# Patient Record
Sex: Male | Born: 1950 | Race: Black or African American | Hispanic: No | Marital: Married | Smoking: Former smoker
Health system: Southern US, Community
[De-identification: ages and names within clinical notes are randomized; demographics above are authoritative.]

## PROBLEM LIST (undated history)

## (undated) DIAGNOSIS — G4733 Obstructive sleep apnea (adult) (pediatric): Secondary | ICD-10-CM

## (undated) DIAGNOSIS — Z9889 Other specified postprocedural states: Secondary | ICD-10-CM

## (undated) DIAGNOSIS — I2510 Atherosclerotic heart disease of native coronary artery without angina pectoris: Secondary | ICD-10-CM

## (undated) DIAGNOSIS — J45909 Unspecified asthma, uncomplicated: Secondary | ICD-10-CM

## (undated) DIAGNOSIS — F40240 Claustrophobia: Secondary | ICD-10-CM

## (undated) DIAGNOSIS — I255 Ischemic cardiomyopathy: Secondary | ICD-10-CM

## (undated) DIAGNOSIS — H409 Unspecified glaucoma: Secondary | ICD-10-CM

## (undated) DIAGNOSIS — E785 Hyperlipidemia, unspecified: Secondary | ICD-10-CM

## (undated) DIAGNOSIS — N529 Male erectile dysfunction, unspecified: Secondary | ICD-10-CM

## (undated) DIAGNOSIS — R001 Bradycardia, unspecified: Secondary | ICD-10-CM

## (undated) DIAGNOSIS — I10 Essential (primary) hypertension: Secondary | ICD-10-CM

## (undated) DIAGNOSIS — E119 Type 2 diabetes mellitus without complications: Secondary | ICD-10-CM

## (undated) DIAGNOSIS — K219 Gastro-esophageal reflux disease without esophagitis: Secondary | ICD-10-CM

## (undated) DIAGNOSIS — N200 Calculus of kidney: Secondary | ICD-10-CM

## (undated) HISTORY — DX: Obstructive sleep apnea (adult) (pediatric): G47.33

## (undated) HISTORY — DX: Male erectile dysfunction, unspecified: N52.9

## (undated) HISTORY — DX: Bradycardia, unspecified: R00.1

## (undated) HISTORY — DX: Unspecified glaucoma: H40.9

## (undated) HISTORY — DX: Ischemic cardiomyopathy: I25.5

## (undated) HISTORY — DX: Gastro-esophageal reflux disease without esophagitis: K21.9

## (undated) HISTORY — DX: Claustrophobia: F40.240

## (undated) HISTORY — DX: Essential (primary) hypertension: I10

## (undated) HISTORY — DX: Hyperlipidemia, unspecified: E78.5

## (undated) HISTORY — DX: Type 2 diabetes mellitus without complications: E11.9

## (undated) HISTORY — PX: CARDIAC CATHETERIZATION: SHX172

## (undated) HISTORY — DX: Other specified postprocedural states: Z98.890

## (undated) HISTORY — DX: Atherosclerotic heart disease of native coronary artery without angina pectoris: I25.10

## (undated) HISTORY — PX: VASECTOMY: SHX75

## (undated) HISTORY — DX: Unspecified asthma, uncomplicated: J45.909

## (undated) HISTORY — DX: Calculus of kidney: N20.0

---

## 1999-10-20 DIAGNOSIS — I2510 Atherosclerotic heart disease of native coronary artery without angina pectoris: Secondary | ICD-10-CM

## 1999-10-20 HISTORY — DX: Atherosclerotic heart disease of native coronary artery without angina pectoris: I25.10

## 2001-04-12 ENCOUNTER — Ambulatory Visit (HOSPITAL_COMMUNITY): Admission: RE | Admit: 2001-04-12 | Discharge: 2001-04-12 | Payer: Self-pay | Admitting: Gastroenterology

## 2001-04-23 ENCOUNTER — Inpatient Hospital Stay (HOSPITAL_COMMUNITY): Admission: EM | Admit: 2001-04-23 | Discharge: 2001-04-27 | Payer: Self-pay | Admitting: Cardiology

## 2001-04-23 ENCOUNTER — Encounter: Payer: Self-pay | Admitting: Emergency Medicine

## 2001-04-25 ENCOUNTER — Encounter: Payer: Self-pay | Admitting: Cardiology

## 2001-05-24 ENCOUNTER — Encounter (HOSPITAL_COMMUNITY): Admission: RE | Admit: 2001-05-24 | Discharge: 2001-08-22 | Payer: Self-pay | Admitting: Cardiology

## 2001-08-23 ENCOUNTER — Encounter (HOSPITAL_COMMUNITY): Admission: RE | Admit: 2001-08-23 | Discharge: 2001-11-21 | Payer: Self-pay | Admitting: Cardiology

## 2003-02-17 DIAGNOSIS — Z9889 Other specified postprocedural states: Secondary | ICD-10-CM

## 2003-02-17 HISTORY — PX: COLONOSCOPY: SHX5424

## 2003-02-17 HISTORY — DX: Other specified postprocedural states: Z98.890

## 2003-12-28 ENCOUNTER — Ambulatory Visit (HOSPITAL_BASED_OUTPATIENT_CLINIC_OR_DEPARTMENT_OTHER): Admission: RE | Admit: 2003-12-28 | Discharge: 2003-12-28 | Payer: Self-pay | Admitting: Otolaryngology

## 2008-12-16 ENCOUNTER — Emergency Department (HOSPITAL_COMMUNITY): Admission: EM | Admit: 2008-12-16 | Discharge: 2008-12-16 | Payer: Self-pay | Admitting: Emergency Medicine

## 2011-02-03 LAB — CBC
HCT: 41.7 % (ref 39.0–52.0)
Hemoglobin: 14.1 g/dL (ref 13.0–17.0)
MCHC: 33.7 g/dL (ref 30.0–36.0)
MCV: 90.2 fL (ref 78.0–100.0)
Platelets: 201 10*3/uL (ref 150–400)
RBC: 4.62 MIL/uL (ref 4.22–5.81)
RDW: 14.3 % (ref 11.5–15.5)
WBC: 12.1 10*3/uL — ABNORMAL HIGH (ref 4.0–10.5)

## 2011-02-03 LAB — URINALYSIS, ROUTINE W REFLEX MICROSCOPIC
Bilirubin Urine: NEGATIVE
Ketones, ur: NEGATIVE mg/dL
Nitrite: NEGATIVE
Protein, ur: 100 mg/dL — AB
Urobilinogen, UA: 0.2 mg/dL (ref 0.0–1.0)

## 2011-02-03 LAB — BASIC METABOLIC PANEL
BUN: 17 mg/dL (ref 6–23)
CO2: 24 mEq/L (ref 19–32)
Calcium: 8.9 mg/dL (ref 8.4–10.5)
Chloride: 109 mEq/L (ref 96–112)
Creatinine, Ser: 1.32 mg/dL (ref 0.4–1.5)
GFR calc Af Amer: >60 mL/min (ref >60)
GFR calc non Af Amer: 56 mL/min — ABNORMAL LOW (ref >60)
Glucose, Bld: 140 mg/dL — ABNORMAL HIGH (ref 70–99)
Potassium: 4.4 mEq/L (ref 3.5–5.1)
Sodium: 142 mEq/L (ref 135–145)

## 2011-02-03 LAB — DIFFERENTIAL
Basophils Absolute: 0 10*3/uL (ref 0.0–0.1)
Basophils Relative: 0 % (ref 0–1)
Eosinophils Absolute: 0.1 10*3/uL (ref 0.0–0.7)
Eosinophils Relative: 1 % (ref 0–5)
Monocytes Absolute: 0.3 10*3/uL (ref 0.1–1.0)
Monocytes Relative: 3 % (ref 3–12)

## 2011-02-03 LAB — URINE MICROSCOPIC-ADD ON

## 2011-02-28 ENCOUNTER — Emergency Department (HOSPITAL_COMMUNITY)
Admission: EM | Admit: 2011-02-28 | Discharge: 2011-02-28 | Disposition: A | Discharge status: Home / Self Care | Payer: Federal, State, Local not specified - PPO | Attending: Emergency Medicine | Admitting: Emergency Medicine

## 2011-02-28 ENCOUNTER — Emergency Department (HOSPITAL_COMMUNITY): Payer: Federal, State, Local not specified - PPO

## 2011-02-28 DIAGNOSIS — Z87442 Personal history of urinary calculi: Secondary | ICD-10-CM | POA: Insufficient documentation

## 2011-02-28 DIAGNOSIS — N23 Unspecified renal colic: Secondary | ICD-10-CM | POA: Insufficient documentation

## 2011-02-28 DIAGNOSIS — R319 Hematuria, unspecified: Secondary | ICD-10-CM | POA: Insufficient documentation

## 2011-02-28 DIAGNOSIS — I10 Essential (primary) hypertension: Secondary | ICD-10-CM | POA: Insufficient documentation

## 2011-02-28 LAB — URINALYSIS, ROUTINE W REFLEX MICROSCOPIC
Glucose, UA: 100 mg/dL — AB
Specific Gravity, Urine: 1.013 (ref 1.005–1.030)
Urobilinogen, UA: 0.2 mg/dL (ref 0.0–1.0)
pH: 7.5 (ref 5.0–8.0)

## 2011-02-28 LAB — POCT I-STAT, CHEM 8
Calcium, Ion: 1.09 mmol/L — ABNORMAL LOW (ref 1.12–1.32)
Chloride: 105 mEq/L (ref 96–112)
HCT: 47 % (ref 39.0–52.0)
Sodium: 140 mEq/L (ref 135–145)

## 2011-02-28 LAB — URINE MICROSCOPIC-ADD ON

## 2011-03-06 NOTE — H&P (Signed)
Scott Schwartz. [facility]  Patient:    Scott Schwartz, Scott Schwartz                      MRN: [id number] Adm. Date:  [id number] Attending:  Ophelia Shoulder Dictator:   Marya Fossa, P.A.                         History and Physical  DATE OF BIRTH:  Nov 14, 1950  ADMISSION DIAGNOSES: 1. Acute myocardial infarction. 2. No prior history of coronary artery disease. 3. Hypertension. 4. Gastroesophageal reflux disease. 5. Remote tobacco abuse. 6. IVP DYE allergy causing angioedema.  HISTORY OF PRESENT ILLNESS:  Scott Schwartz is a pleasant 60 year old married black male, new patient of Dr. Chanda Busing.  He has no prior history of coronary artery disease but does have hypertension and unknown lipid status. Remote tobacco abuse.  This afternoon, he was mowing his yard and then was cleaning off the patio when he developed severe indigestion and diaphoresis.  He went in the house and rested and took an Inderal with only mild relief.  He has a history of GERD and thought this was just a bad case.  He had an appointment at the church yesterday afternoon and went to that but on the way home had persistent and increasing discomfort rated a 9-10/10.  He had his wife bring him to the urgent care on [address].  Blood pressure was 150/110.  The patient was diaphoretic.  He was given a total of two sublingual nitroglycerins.  EMs was then called and symptoms improved to a 2.5/10.  He was also given baby aspirin.  EKG when EMS arrived showed sinus rhythm with ST elevation but this EKG was not present.  The patient was initially taken to [facility] and then transferred to [facility] for acute intervention.  EKG showed inferior ST elevation with ST changes laterally as well.  The patient was taken directly to the cardiac catheterization lab by Dr. Chanda Busing for emergency intervention.  ALLERGIES:  IVP DYE causing angioedema.  MEDICATIONS: 1.  Norvasc 5 mg a day. 2. Prilosec 20 mg a day.  PAST MEDICAL HISTORY:  Hypertension and reflux.  He has had a vasectomy in the past.  History of nephrolithiasis.  SOCIAL HISTORY:  The patient is married x 27 years.  He is the father of three.  He has no grandchildren.  He quit smoking 25 years ago.  He does smoke an occasional cigar.  Rare alcohol use.  No drugs.  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.  FAMILY HISTORY:  Father deceased age 1 of an MI.  His first MI was at age 75. Mother alive, has cancer.  One brother with hypertension.  One sister alive and well.  REVIEW OF SYSTEMS:  See history of present illness.  No palpitations or nausea.  No presyncope.  No melena or hematuria.  No significant weight changes.  PHYSICAL EXAMINATION:  (This physical exam is done on the day following his acute intervention).  VITAL SIGNS:  Blood pressure 120/60, pulse 60.  GENERAL:  The patient is alert and oriented x 3 in no acute distress sitting in the bedside chair.  HEENT:  Normocephalic, atraumatic.  PERRL, EOMI.  Nares patent.  Pharynx benign.  Positive glasses.  NECK:  Supple without bruits or masses.  LUNGS:  Clear to auscultation.  CARDIOVASCULAR:  Bradycardic rate.  No murmur.  Regular rhythm.  Normal S1, S2.  ABDOMEN:  Soft, nontender, nondistended.  Normoactive bowel sounds x 4 quadrants.  No HSM.  EXTREMITIES:  2+ femoral pulses.  Right groin is stable post catheterization. 2+ distal pulses bilaterally without edema.  LABORATORY DATA:  EKG today shows normal sinus rhythm with evolving MI pattern.  Labs pending.  HOSPITAL COURSE:  The patient is admitted for acute myocardial infarction.  He was taken directly to the cardiac catheterization lab by Dr. Elsie Lincoln.  He was treated with IV heparin, IV nitroglycerin, and IIB/IIIA inhibitor therapy, as well as Plavix and aspirin.  We will check a lipid profile in the morning.  We will start Lopressor and  Altace. DD:  04/24/01 TD:  04/24/01 Job: [id number]

## 2011-03-06 NOTE — H&P (Signed)
[place]. [facility]  Patient:    Scott Schwartz, Scott Schwartz                      MRN: [id number] Adm. Date:  [id number] Attending:  Ophelia Shoulder CC:         Barry Dienes. Eloise Harman, M.D.,  [facility]  Cardiac Catheterization Lab   History and Physical  PROCEDURES PERFORMED: 1. Cutting balloon angioplasty of mid left anterior descending artery which    is the infarct-related vessel causing the patients heart attack. 2. Balloon angioplasty of the ostium first diagonal branch of the left    anterior descending artery. 3. Selective coronary angiography by Judkins technique. 4. Retrograde left heart catheterization. 5. Left ventricular angiography.  CARDIOLOGIST:  Madaline Savage, M.D.  COMPLICATIONS:  None.  ENTRY SITE:  Right femoral.  DYE USED:  Omnipaque.  OTHER MEDICATIONS GIVEN:  Because of the patients history of allergy to IVP DYE from childhood, the patient was given 150 mg of Solu-Medrol in the catheterization lab as well as 40 mg IV Pepcid.  No allergic manifestations were noted during the case.  The patient also received Integrilin by double bolus infusion as well as weight-adjusted heparin.  His ACT was 270 during the case.  Versed was also given for sedation.  PATIENT PROFILE:  The patient is a 60 year old gentleman who had severe discomfort at 10 a.m. this morning and presented to an urgent care center where his EKG was abnormal.  It is my understanding that the patient was transferred from the urgent care center to [facility] by ambulance for suspicion of acute MI.  He there was seen by the emergency department physician who called me and faxed me an EKG which confirmed his suspicion that the patient was having anterolateral injury pattern.  The patient was transferred to the catheterization lab at [facility], and emergency cardiac catheterization was begun.  RESULTS:  PRESSURES:  The  left ventricular pressure was 145/20, central aortic pressure was 145/90 with a mean of 115.  No aortic valve gradient noted by pullback technique.  ANGIOGRAPHIC RESULTS:  The left main coronary artery was large and normal.  The LAD coursed to the cardiac apex, wrapped around the apex, and was a large caliber vessel.  The LAD contained a 75% stenosis in the mid portion of the vessel just beyond the septal perforator that was approximately 12 to 15 mm in length and concentric in nature, B1 type lesion.  The distal vessel appeared normal.  No clot-like material was found.  There was some hypodense plaque-like material.  The ostium of the first diagonal arising just before the first septal perforator branch contained a 75% ostial stenosis.  The left circumflex was nondominant and gave rise to one marginal branch which was normal.  The right coronary artery was large and dominant and showed no lesions.  The left ventricle showed absences of any mitral regurgitation.  It showed a mildly dilated ventricle.  The anterobasilar wall moved normally.  The anterolateral wall was mildly hypokinetic.  The anteroapical, apical, and inferoapical wall showed severe hypokinesis.  I estimated ejection fraction at 40%.  No LV thrombus was noted.  INTERVENTIONAL PROCEDURE:  Interventional procedure was performed with a 7-French 4 left guide catheter without side holes, a high-torque floppy guidewire, and sequential use of a 3.25 x 15 mm cutting balloon, then a 3.5 x 10 mm cutting balloon, and finally a 3.75 x 15 mm  cutting balloon.  Peak inflation pressure on the last cutting balloon was to 6 atmospheres of pressure, corresponding to an estimated luminal diameter of 3.75.  At the completion of that last cutting balloon inflation, the lumen appeared smooth and greatly improved from 75% to 10% residual.  The patient still had residual pain and ST elevation, so I drove a guidewire down into his  diagonal, which was a sharp right angle turn, and dilated the ostial portions of the diagonal with a 3.0 x 9 mm Maverick balloon on three different occasions to a peak inflation pressure of 6 atmospheres of pressure.  No complications occurred.  That lesion was reduced from 75% to 0% residual.  Both diagonal and distal LAD maintained TIMI-3 flow during the procedure.  ST segment and chest pain improved after the last balloon inflation.  FINAL DIAGNOSES: 1. Acute anterolateral and apical myocardial infarction, onset 10 a.m..    Infarct-related vessel, left anterior descending artery and diagonal. 2. Successful percutaneous transluminal coronary angioplasty of both diagonal    and left anterior descending artery with reduction of both 75% lesions to    10% residual. 3. Normal circumflex and right coronary artery.  PLAN:  Medical therapy. DD:  04/23/01 TD:  04/23/01 Job: [id number]

## 2011-03-06 NOTE — Discharge Summary (Signed)
[place]. [facility]  Patient:    Scott Schwartz, Scott Schwartz                      MRN: [id number] Adm. Date:  [id number] Disc. Date: 04/27/01 Attending:  Ophelia Shoulder Dictator:   Halford Decamp. Delanna Ahmadi, R.N., N.P. CC:         Barry Dienes. Eloise Harman, M.D.   Discharge Summary  HISTORY OF PRESENT ILLNESS:  Scott Schwartz is a 60 year old, married, African-American, a new patient of Madaline Savage, M.D., who was admitted with an acute MI.  His EKG showed inferior ST elevation and some ST changes in the lateral leads.  He apparently was experiencing chest pain on and off, somewhat severe.  He went to urgent care on [address] and then was transferred to [facility] where his EKG showed an acute MI.  HOSPITAL COURSE:  He was taken to the catheterization lab immediately by Madaline Savage, M.D.  He was found to have two blockages, 75% LAD and mid after diagonal 1, and a 75% ostial diagonal 1.  He underwent cutting balloon PTCA of his LAD, reduced from 75% to 10% and balloon PTCA of his ostial diagonal.  He was placed on heparin.  ACE inhibitor and statin were added with Plavix and beta blocker.  His outpatient Norvasc was held.  He was seen by cardiac rehabilitation phase 1 and referred for phase 2.  On April 27, 2001, he was considered stable to be discharged home.  His kidney and electrolyte function were within normal limits.  He had no arrhythmias.  His telemetry showed sinus bradycardia, rate 58, and sinus rhythm, rate 64.  LABORATORY DATA:  On April 26, 2001, the sodium was 137, potassium 4.1, BUN 18, creatinine 1.0, and glucose 117.  The hemoglobin was 13, hematocrit 37.3, platelets 272, and WBC 14.1.  To note, he did receive steroids secondary to IVP dye allergy.  His CPK-MB #1 was 365/50.8 and #2 was 536/94.5.  The total cholesterol was 173, triglycerides 127, HDL 48, and LDL 100.  The TSH was 0.06.  The chest x-ray on April 25, 2001, showed no active  disease.  DISCHARGE MEDICATIONS: 1. Enteric-coated aspirin 325 mg once per day. 2. Plavix 75 mg once per day x 1 month. 3. Metoprolol 25 mg twice per day. 4. Zocor 20 mg at bedtime every night. 5. Altace 2.5 mg twice per day. 6. Prilosec as taken previously.  ACTIVITY:  No strenuous activity.  He is not to return to work until seen in the office.  DIET:  He should be on a low-fat diet.  WOUND CARE:  If he has any problems with his groin, he will call our office.  FOLLOW-UP:  He will follow up with Madaline Savage, M.D., as an outpatient.  DISCHARGE DIAGNOSES: 1. Acute myocardial infarction. 2. Atherosclerotic cardiovascular disease, status post cardiac catheterization    emergently secondary to acute myocardial infarction with cutting balloon    angioplasty of his mid left anterior descending and balloon angioplasty of    his diagonal 1. 3. Ejection fraction at the time of cardiac catheterization was 40% with    severe hypokinesis of his apex.  A 2-D echocardiogram was done with    resolution of ejection fraction to normal. 4. Hypertension. 5. Premature family history of coronary artery disease. 6. 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 DD:  04/27/01 TD:  04/27/01 Job: [id number]

## 2012-07-21 DIAGNOSIS — N529 Male erectile dysfunction, unspecified: Secondary | ICD-10-CM | POA: Insufficient documentation

## 2012-07-21 DIAGNOSIS — I10 Essential (primary) hypertension: Secondary | ICD-10-CM | POA: Insufficient documentation

## 2012-07-21 DIAGNOSIS — E7800 Pure hypercholesterolemia, unspecified: Secondary | ICD-10-CM | POA: Insufficient documentation

## 2012-07-21 DIAGNOSIS — I252 Old myocardial infarction: Secondary | ICD-10-CM | POA: Insufficient documentation

## 2013-09-01 ENCOUNTER — Telehealth: Payer: Self-pay | Admitting: Neurology

## 2013-09-01 NOTE — Telephone Encounter (Signed)
I called and left a message for the patient to callback to the office to schedule his sleep consult with Dr. Vickey Huger.

## 2013-09-22 ENCOUNTER — Encounter: Payer: Self-pay | Admitting: Neurology

## 2013-09-22 ENCOUNTER — Encounter (INDEPENDENT_AMBULATORY_CARE_PROVIDER_SITE_OTHER): Payer: Self-pay

## 2013-09-22 ENCOUNTER — Ambulatory Visit (INDEPENDENT_AMBULATORY_CARE_PROVIDER_SITE_OTHER): Payer: Federal, State, Local not specified - PPO | Admitting: Neurology

## 2013-09-22 VITALS — BP 102/70 | HR 54 | Ht 66.75 in | Wt 177.0 lb

## 2013-09-22 DIAGNOSIS — Z9989 Dependence on other enabling machines and devices: Secondary | ICD-10-CM

## 2013-09-22 DIAGNOSIS — G478 Other sleep disorders: Secondary | ICD-10-CM

## 2013-09-22 DIAGNOSIS — I25810 Atherosclerosis of coronary artery bypass graft(s) without angina pectoris: Secondary | ICD-10-CM

## 2013-09-22 DIAGNOSIS — G4733 Obstructive sleep apnea (adult) (pediatric): Secondary | ICD-10-CM

## 2013-09-22 DIAGNOSIS — F40240 Claustrophobia: Secondary | ICD-10-CM

## 2013-09-22 DIAGNOSIS — F40298 Other specified phobia: Secondary | ICD-10-CM

## 2013-09-22 HISTORY — DX: Obstructive sleep apnea (adult) (pediatric): G47.33

## 2013-09-22 HISTORY — DX: Claustrophobia: F40.240

## 2013-09-22 NOTE — Patient Instructions (Signed)
CPAP and BIPAP Information CPAP and BIPAP are methods of helping you breathe with the use of air pressure. CPAP stands for "continuous positive airway pressure." BIPAP stands for "bi-level positive airway pressure." In both methods, air is blown into your air passages to help keep you breathing well. With CPAP, the amount of pressure stays the same while you breathe in and out. CPAP is most commonly used for obstructive sleep apnea. For obstructive sleep apnea, CPAP works by holding your airways open so that they do not collapse when your muscles relax during sleep. BIPAP is similar to CPAP except the amount of pressure is increased when you inhale. This helps you take larger breaths. Your health care provider will recommend whether CPAP or BIPAP would be more helpful for you.  WHY ARE CPAP AND BIPAP TREATMENTS USED? CPAP or BIPAP can be helpful if you have:   Sleep apnea.   Chronic obstructive pulmonary disease (COPD).   Diseases that weaken the muscles of the chest, including muscular dystrophy or neurological diseases such as amyotrophic lateral sclerosis (ALS).   Other problems that cause breathing to be weak, abnormal, or difficult.  HOW IS CPAP OR BIPAP ADMINISTERED? Both CPAP and BIPAP are provided by a small machine with a flexible plastic tube that attaches to a plastic mask. The mask fits on your face, and air is blown into your air passages through your nose or mouth. The amount of pressure that is used to blow the air into your air passages can be set on the machine. Your health care provider will determine the pressure setting that should be used based on your individual needs.  WHEN SHOULD CPAP OR BIPAP BE USED? In most cases, the mask is worn only when sleeping. Generally, you will need to wear the mask throughout the night and during the daytime if you take a nap. In a few cases involving certain medical conditions, people also need to wear the mask at other times when they are  awake. Follow your health care provider's instructions for when to use the machine.  USING THE MASK  Because the mask needs to be snug, some people feel a trapped or closed-in feeling (claustrophobic) when first using the mask. You may need to get used to the mask gradually. To do this, you can first hold the mask loosely over your nose or mouth. Gradually apply the mask more snugly. You can also gradually increase the amount of time that you use the mask.   Masks are available in various types and sizes. Some fit over your mouth and nose, and some fit over just your nose. If your mask does not fit well, talk to your health care provider about getting a different one.  If you are using a nasal mask and you tend to breathe through your mouth, a chin strap may be applied to help keep your mouth closed.   The CPAP and BIPAP machines have alarms that may sound if the mask comes off or develops a leak.   If you have trouble with the mask, it is very important that you talk to your health care provider about finding a way to make the mask easier to tolerate. Do not stop using the mask. This could have a negative impact on your health. TIPS FOR USING THE MACHINE  Place your CPAP or BIPAP machine on a secure table or stand near an electrical outlet.   Know where the on-off switch is located on the machine.     Follow your health care provider's instructions for how to set the pressure on your machine and when you should use it.   Do not eat or drink while the CPAP or BIPAP machine is on. Food or fluids could get pushed into your lungs by the pressure of the CPAP or BIPAP.  Do not smoke. Tobacco smoke residue can damage the machine.   For home use, CPAP and BIPAP machines can be rented or purchased through home health care companies. Many different brands of machines are available. Renting a machine before purchasing may help you find out which particular machine works well for you. SEEK  IMMEDIATE MEDICAL CARE IF:  You have redness or open areas around your nose or mouth where the mask fits.   You have trouble operating the CPAP or BIPAP machine.   You cannot tolerate wearing the CPAP or BIPAP mask.  Document Released: 07/03/2004 Document Revised: 06/07/2013 Document Reviewed: 05/04/2013 Exit[employer] Patient Information 2014 [employer]. Sleep Apnea  Sleep apnea is a sleep disorder characterized by abnormal pauses in breathing while you sleep. When your breathing pauses, the level of oxygen in your blood decreases. This causes you to move out of deep sleep and into light sleep. As a result, your quality of sleep is poor, and the system that carries your blood throughout your body (cardiovascular system) experiences stress. If sleep apnea remains untreated, the following conditions can develop:  High blood pressure (hypertension).  Coronary artery disease.  Inability to achieve or maintain an erection (impotence).  Impairment of your thought process (cognitive dysfunction). There are three types of sleep apnea: 1. Obstructive sleep apnea Pauses in breathing during sleep because of a blocked airway. 2. Central sleep apnea Pauses in breathing during sleep because the area of the brain that controls your breathing does not send the correct signals to the muscles that control breathing. 3. Mixed sleep apnea A combination of both obstructive and central sleep apnea. RISK FACTORS The following risk factors can increase your risk of developing sleep apnea:  Being overweight.  Smoking.  Having narrow passages in your nose and throat.  Being of older age.  Being male.  Alcohol use.  Sedative and tranquilizer use.  Ethnicity. Among individuals younger than 35 years, African Americans are at increased risk of sleep apnea. SYMPTOMS   Difficulty staying asleep.  Daytime sleepiness and fatigue.  Loss of energy.  Irritability.  Loud, heavy snoring.  Morning  headaches.  Trouble concentrating.  Forgetfulness.  Decreased interest in sex. DIAGNOSIS  In order to diagnose sleep apnea, your caregiver will perform a physical examination. Your caregiver may suggest that you take a home sleep test. Your caregiver may also recommend that you spend the night in a sleep lab. In the sleep lab, several monitors record information about your heart, lungs, and brain while you sleep. Your leg and arm movements and blood oxygen level are also recorded. TREATMENT The following actions may help to resolve mild sleep apnea:  Sleeping on your side.   Using a decongestant if you have nasal congestion.   Avoiding the use of depressants, including alcohol, sedatives, and narcotics.   Losing weight and modifying your diet if you are overweight. There also are devices and treatments to help open your airway:  Oral appliances. These are custom-made mouthpieces that shift your lower jaw forward and slightly open your bite. This opens your airway.  Devices that create positive airway pressure. This positive pressure "splints" your airway open to help you breathe   better during sleep. The following devices create positive airway pressure:  Continuous positive airway pressure (CPAP) device. The CPAP device creates a continuous level of air pressure with an air pump. The air is delivered to your airway through a mask while you sleep. This continuous pressure keeps your airway open.  Nasal expiratory positive airway pressure (EPAP) device. The EPAP device creates positive air pressure as you exhale. The device consists of single-use valves, which are inserted into each nostril and held in place by adhesive. The valves create very little resistance when you inhale but create much more resistance when you exhale. That increased resistance creates the positive airway pressure. This positive pressure while you exhale keeps your airway open, making it easier to breath when you  inhale again.  Bilevel positive airway pressure (BPAP) device. The BPAP device is used mainly in patients with central sleep apnea. This device is similar to the CPAP device because it also uses an air pump to deliver continuous air pressure through a mask. However, with the BPAP machine, the pressure is set at two different levels. The pressure when you exhale is lower than the pressure when you inhale.  Surgery. Typically, surgery is only done if you cannot comply with less invasive treatments or if the less invasive treatments do not improve your condition. Surgery involves removing excess tissue in your airway to create a wider passage way. Document Released: 09/25/2002 Document Revised: 01/30/2013 Document Reviewed: 02/11/2012 Exit[employer] Patient Information 2014 [employer].  

## 2013-09-22 NOTE — Progress Notes (Addendum)
[facility]  Provider:  Melvyn Novas, M D  Referring Provider: Jarome Matin, MD Primary Care Physician:  Garlan Fillers, MD  Chief Complaint  Patient presents with  . Sleeping Problem    HPI:  Scott Schwartz is a 62 y.o. male  Is seen here as a referral  from Dr. Eloise Harman for evaluation of sleep apnea. Scott Schwartz is a recently retired , right-handed, African American gentleman, who  presents with a past medical history of myocardial infarction / coronary artery disease , had frequent angina pectoris,  diabetes mellitus, hyperlipidemia, hypertension and carries also a diagnosis of asthma and GERD.  He was diagnosed with sleep apnea over 10 years ago at [facility] , a mild form of  apnea ,he stated.  was prescribed a CPAP , but was never followed by a sleep clinic and became non compliant from the start.  CPAP was not well tolerated and he felt he could breath-  Interestingly the study had also revealed severe periodic limb movements, sleep  talking and twitching at night.   Scott Schwartz reported that her husband was still  snoring very loudly -interrupting her sleep in the process. He felt fatigued, drained and cognitively without energy.    For that reason a sleep study was repeated on 03-24-12, at [facility] sleep at [facility]:  The patient again had a rather  mild apnea, in his case the AHI was 14.0,  his RDI however was moderate , indicating upper airway resistance , at 26 per hour.  In addition supine sleep accentuated his apnea to an AHI of 33.8 per hour- but REM sleep seem not to have an effect.  He did not have prolonged desaturations only 2.5 minutes of desaturations were noted.  Due to his co-morbidities, CAD, asthma  and his bradycardia, he was split : CPAP was initiated at  5 cm  and he was titrated to a pressure of 8 cm water.  Already at a pressure of 7 cm water there was a reduction of the AHI to 0.0.  Notable was also that the patient had significant REM  sleep rebound (25%).  The patient was able to sleep in supine position at 7 cm water pressure and his periodic limb movement eased under CPAP titration.  It was however evident that he did not tolerate the CPAP that well - and his sleep remained fragmented.  The patient did better with a small  nasal pillow FX interface in medium-size.  Now 18 month after titration, Mr Norrod reports he is not using the machine - had in fact never reset the machine and was concerned about the related costs- the same for the  Mask. He has still his old machine from 2005, the one "from the closet'.   His Epworth score is 9 points,  But FSS 46 points, He still feels that concentration and focus, his cognition are not improved. He reports choking , suffocating feeling and felt that a switch to a nasal pillow could be helpful. He had one ordered , but was financially unable to afford a machine and new mask.  Sleep habits:   The patient reports that he wakes up spontaneously between 5 and 5:30 AM, normally does need an alarm. He is retired , has no regular appointments.  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 He eats breakfast between 7.30 and 8 PM. Every morning, week days and week end have similar routines.  Coffee at one cup per day, no sodas. No tobacco  use, no ETOH use. Skim milk, 1 cup.  He will sometimes nap in early afternoon , after Lunch . Usually less than 10 minutes of nap time on the sofa- while watching TV .  He is leaving the home regularly to garden , he walks and he exposes himself to daylight.  He is a member at the [facility] but has lapsed in his routine over the last 6 month. Bedtime is usually after one hour of TV- TV  in the bedroom-  around 11 PM, falls asleep promptly.   No nocturia. He had trouble sleeping with the CPAP, therefor there were no accessible data on the machine. He goes to bed in a cool , dark , but not quiet place  ( states his wife watches TV on a wide screen , the emitting light  bothers him ) .  Wife ( who is not present) still reports him snoring loudly, and frequently kicking. According to his  wife's reports , she is noticing him thrashing about  1-2 times weekly , frequency increasingly over the last 12 month. Loudly yelling, defending or  fighting someone or something in his dreams.  His spells last 2 minutes , may be single or up to 3 times at night , the content of the dream is often  recalled by the patient- he feels in danger, threatened.  These spells ocurr between 3-5 AM, likely REM BD.  He feels poorly rested after one of ' those nights' . He rarely has a morning headache. He gets headaches frequently around noon time.   He can fall asleep but often will not stay asleep.  Only once did he leave the bed and watched under the bed - went back to sleep. He wakes with a very dry mouth and phlegm, no dry eyes.    Review of Systems: Out of a complete 14 system review, the patient complains of only the following symptoms, and all other reviewed systems are negative. Epworth 9 points. FSS  43   , GDS 2 points.  Neck pain, neck stiffness, snoring apnea frequent waking sleep talking sleep acting out dreams, headaches. There were no recent hospitalizations or surgeries no immediate changes in the family history.    A review of the last laboratories the sleep study from 2013 labs from November 2014 . The only concern here is a rather high triglyceride level and a slight unfavorable LDL/ HDL ratio.  History   Social History  . Marital Status: Single    Spouse Name: N/A    Number of Children: 3  . Years of Education: N/A   Occupational History  . RET     POSTAL   Social History Main Topics  . Smoking status: Former Smoker -- 7 years    Types: Cigarettes  . Smokeless tobacco: Never Used     Comment: Pt Quit 1970's  . Alcohol Use: No  . Drug Use: No  . Sexual Activity: Not on file   Other Topics Concern  . Not on file   Social History Narrative   Pt  married 3 children    1 grandchild   Ret [employer]   Former smoker 1 pack x's 36 yrs    Quit in 1970's    Family History  Problem Relation Age of Onset  . Leukemia Mother 60  . CAD Father 50  . CAD Brother     Past Medical History  Diagnosis Date  . History of colonoscopy 02/17/2003  . GERD (  gastroesophageal reflux disease)   . CAD (coronary artery disease)   . MI (myocardial infarction) 2002  . Hyperlipidemia   . Asthma   . Glaucoma     Borderline  . OSA on CPAP     Past Surgical History  Procedure Laterality Date  . Cardiac catheterization    . Colonoscopy  02/2003  . Vasectomy      Current Outpatient Prescriptions  Medication Sig Dispense Refill  . Albuterol (PROVENTIL IN) Inhale 1-2 puffs into the lungs 3 (three) times daily.      Marland Kitchen ALPRAZolam (XANAX) 0.25 MG tablet Take 0.25 mg by mouth at bedtime as needed for anxiety.      . AMLODIPINE BESYLATE PO Take 5 mg by mouth.      Marland Kitchen aspirin 81 MG tablet Take 81 mg by mouth daily.      . Fish Oil OIL by Does not apply route.      . fluticasone (FLONASE) 50 MCG/ACT nasal spray Place into both nostrils daily.      . folic acid (FOLVITE) 1 MG tablet Take 1 mg by mouth daily.      Marland Kitchen losartan (COZAAR) 100 MG tablet Take 100 mg by mouth daily.      . metoprolol succinate (TOPROL-XL) 50 MG 24 hr tablet Take 50 mg by mouth daily. Take with or immediately following a meal.      . montelukast (SINGULAIR) 10 MG tablet Take 10 mg by mouth at bedtime.      . Multiple Vitamin (MULTIVITAMIN) tablet Take 1 tablet by mouth daily.      . nitroGLYCERIN (NITROLINGUAL) 0.4 MG/SPRAY spray Place 1 spray under the tongue every 5 (five) minutes x 3 doses as needed for chest pain.      . pravastatin (PRAVACHOL) 40 MG tablet Take 40 mg by mouth daily.      . propranolol (INDERAL) 10 MG tablet Take 10 mg by mouth 2 (two) times daily.      . sildenafil (VIAGRA) 100 MG tablet Take 100 mg by mouth daily as needed for erectile dysfunction.      .  silodosin (RAPAFLO) 8 MG CAPS capsule Take 8 mg by mouth daily with breakfast.      . tamsulosin (FLOMAX) 0.4 MG CAPS capsule Take 0.4 mg by mouth.       No current facility-administered medications for this visit.    Allergies as of 09/22/2013 - Review Complete 09/22/2013  Allergen Reaction Noted  . Ivp dye [iodinated diagnostic agents] Shortness Of Breath 09/22/2013    Vitals: BP 102/70  Pulse 54  Ht 5' 6.75" (1.695 m)  Wt 177 lb (80.287 kg)  BMI 27.95 kg/m2 Last Weight:  Wt Readings from Last 1 Encounters:  09/22/13 177 lb (80.287 kg)   Last Height:   Ht Readings from Last 1 Encounters:  09/22/13 5' 6.75" (1.695 m)    Physical exam:  General: The patient is awake, alert and appears not in acute distress. The patient is well groomed. Head: Normocephalic, atraumatic. Neck is supple. Mallampati 2- elongated uvula lifts above tongue ground , neck circumference: 16, nasal airway unrestricted.  Cardiovascular:  Regular rate and rhythm , without  murmurs or carotid bruit, and without distended neck veins. Respiratory: Lungs are clear to auscultation. Skin:  Without evidence of edema, or rash Trunk: BMI is 28-   has normal posture.  Neurologic exam : The patient is awake and alert, oriented to place and time.  Memory subjective  described as  intact.  There is a  Subjective limitation to his  attention span & concentration ability.  Speech is fluent without  dysarthria, dysphonia or aphasia. Mood and affect are appropriate.  Cranial nerves: Pupils are equal and briskly reactive to light. Funduscopic exam without  evidence of pallor or edema. Extraocular movements  in vertical and horizontal planes intact and without nystagmus. Visual fields by finger perimetry are intact. Hearing to finger rub intact.  Facial sensation intact to fine touch.  Facial motor strength is symmetric and tongue and uvula move midline.  Motor exam:    Normal tone , muscle bulk and symmetric strength in  all extremities.  Sensory:  Fine touch, pinprick and vibration were tested in all extremities.  Proprioception is normal.  Coordination: Rapid alternating movements in the fingers/hands is tested and normal.  Finger-to-nose maneuver tested and normal without evidence of ataxia, dysmetria or tremor.  Gait and station: Patient walks without assistive device,  and is able to rise form a chair without  bracing , unassisted to climb up to the exam table.  Strength within normal limits. Stance is stable and normal.  Tandem gait is slighlty fragmented. Romberg testing is  normal.  Deep tendon reflexes: in the  upper and lower extremities are symmetric and intact. Babinski maneuver response is downgoing.   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 positional dependent according to spouse observation- he snores more on his left side , but feels best in that position. His wife reports apneas .  3)claustrophobia.  Plan : I like for the Patien tot take a nasal pillow mask home and "to play with it " until he feels comfortable wearing it attached to CPAP .  He went after this appointment to the sleep lab , and meets with the technologist to fit a mask.   His machine was never reset to 7 cm water. I will invite him for a re-titration after desensitization.   PS : note to tech : BCBS patient , retirement federal BCBS - 4% but [facility] does not need to be established again, direct re-titration. Return for a retitration and to obtain new machine after desensitization.  I Plan:  Treatment plan and additional workup : untreated OSA and UARS - needs re titration on nasal pillow, desensitization, gentle titration. RV in 8 weeks with me. Has to bring his than new  machine.

## 2014-07-02 ENCOUNTER — Telehealth: Payer: Self-pay | Admitting: Cardiology

## 2014-07-02 NOTE — Telephone Encounter (Signed)
Letterhead faxed to [facility] to obtain Pt's records for NP appt 9.17.15 9.14.15/km

## 2014-07-05 ENCOUNTER — Ambulatory Visit (INDEPENDENT_AMBULATORY_CARE_PROVIDER_SITE_OTHER): Payer: Federal, State, Local not specified - PPO | Admitting: Cardiology

## 2014-07-05 ENCOUNTER — Encounter: Payer: Self-pay | Admitting: Cardiology

## 2014-07-05 VITALS — BP 104/66 | HR 48 | Ht 66.75 in | Wt 179.4 lb

## 2014-07-05 DIAGNOSIS — I10 Essential (primary) hypertension: Secondary | ICD-10-CM

## 2014-07-05 DIAGNOSIS — E119 Type 2 diabetes mellitus without complications: Secondary | ICD-10-CM | POA: Insufficient documentation

## 2014-07-05 DIAGNOSIS — R609 Edema, unspecified: Secondary | ICD-10-CM

## 2014-07-05 DIAGNOSIS — E785 Hyperlipidemia, unspecified: Secondary | ICD-10-CM

## 2014-07-05 DIAGNOSIS — R600 Localized edema: Secondary | ICD-10-CM | POA: Insufficient documentation

## 2014-07-05 DIAGNOSIS — N200 Calculus of kidney: Secondary | ICD-10-CM | POA: Insufficient documentation

## 2014-07-05 DIAGNOSIS — I2510 Atherosclerotic heart disease of native coronary artery without angina pectoris: Secondary | ICD-10-CM

## 2014-07-05 DIAGNOSIS — I209 Angina pectoris, unspecified: Secondary | ICD-10-CM

## 2014-07-05 DIAGNOSIS — I25810 Atherosclerosis of coronary artery bypass graft(s) without angina pectoris: Secondary | ICD-10-CM

## 2014-07-05 DIAGNOSIS — J45909 Unspecified asthma, uncomplicated: Secondary | ICD-10-CM | POA: Insufficient documentation

## 2014-07-05 MED ORDER — METOPROLOL SUCCINATE ER 25 MG PO TB24: 25.0000 mg | ORAL_TABLET | Freq: Every day | ORAL | Status: DC

## 2014-07-05 NOTE — Progress Notes (Signed)
[address] [place], [place]  [address] Phone: [phone] Fax:  [phone]  Date:  07/05/2014   ID:  Scott Schwartz, DOB Mar 06, 1951, MRN [id number]  PCP:  Garlan Fillers, MD  Cardiologist:  Armanda Magic, MD     History of Present Illness: This is a 63yo WM with a history fo ASCAD and remote MI in 2002 and then CABG 09/2013, OSA on CPAP, GERD, HTN and dyslipidemia who presents today to establish care with Cardiology.  He is doing well.  He denies any chest pain,  dizziness, palpitations or syncope.  He has not been on statins due to side effects and "fear of the drug" in the past.  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  He does not have DOE when working out though.  He has chronic LE edema which is stable on diuretics.  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  He has noticed some fatigue as well.     Wt Readings from Last 3 Encounters:  07/05/14 179 lb 6.4 oz (81.375 kg)  09/22/13 177 lb (80.287 kg)  08/28/13 176 lb (79.833 kg)     Past Medical History  Diagnosis Date  . History of colonoscopy 02/17/2003  . GERD (gastroesophageal reflux disease)   . Hyperlipidemia   . Asthma   . Glaucoma     Borderline  . OSA (obstructive sleep apnea) 09/22/2013  . Claustrophobia 09/22/2013  . Hypertension   . CAD (coronary artery disease) 2001    anterior STEMI 2001 with cutting balloon and PVCI of the mid LAD    Current Outpatient Prescriptions  Medication Sig Dispense Refill  . Albuterol (PROVENTIL IN) Inhale 1-2 puffs into the lungs 3 (three) times daily.      Marland Kitchen ALPRAZolam (XANAX) 0.25 MG tablet Take 0.25 mg by mouth at bedtime as needed for anxiety.      . AMLODIPINE BESYLATE PO Take 5 mg by mouth daily.       Marland Kitchen aspirin 81 MG tablet Take 81 mg by mouth daily.      . Fish Oil OIL by Does not apply route.      . fluticasone (FLONASE)  50 MCG/ACT nasal spray Place into both nostrils daily.      . folic acid (FOLVITE) 1 MG tablet Take 1 mg by mouth daily.      . hydrochlorothiazide (HYDRODIURIL) 12.5 MG tablet Take 12.5 mg by mouth daily as needed (For swelling).       . lansoprazole (PREVACID) 15 MG capsule Take by mouth daily at 12 noon. Acid Reducer      . losartan (COZAAR) 100 MG tablet Take 100 mg by mouth daily.      . metoprolol succinate (TOPROL-XL) 50 MG 24 hr tablet Take 50 mg by mouth daily. Take with or immediately following a meal.      . montelukast (SINGULAIR) 10 MG tablet Take 10 mg by mouth at bedtime.      . Multiple Vitamin (MULTIVITAMIN) tablet Take 1 tablet by mouth daily.      . nitroGLYCERIN (NITROLINGUAL) 0.4 MG/SPRAY spray Place 1 spray under the tongue every 5 (five) minutes x 3 doses as needed for chest pain.      . sildenafil (VIAGRA) 100 MG tablet Take 100 mg by mouth daily as needed for erectile dysfunction.  No current facility-administered medications for this visit.    Allergies:    Allergies  Allergen Reactions  . Ivp Dye [Iodinated Diagnostic Agents] Shortness Of Breath    Social History:  The patient  reports that he has quit smoking. His smoking use included Cigarettes. He smoked 0.00 packs per day for 7 years. He has never used smokeless tobacco. He reports that he does not drink alcohol or use illicit drugs.   Family H69istory:  The patient's family history includes CAD in his brother; CAD (age of onset: 56) in his father; Leukemia (age of onset: 19) in his mother.   ROS:  Please see the history of present illness.      All other systems reviewed and negative.   PHYSICAL EXAM: VS:  BP 104/66  Pulse 48  Ht 5' 6.75" (1.695 m)  Wt 179 lb 6.4 oz (81.375 kg)  BMI 28.32 kg/m2 Well nourished, well developed, in no acute distress HEENT: normal Neck: no JVD Cardiac:  normal S1, S2; RRR; no murmur Lungs:  clear to auscultation bilaterally, no wheezing, rhonchi or rales Abd: soft,  nontender, no hepatomegaly Ext: no edema Skin: warm and dry Neuro:  CNs 2-12 intact, no focal abnormalities noted  EKG:  Sinus bradycardia at 48bpm with no ST changes     ASSESSMENT AND PLAN:  1. ASCAD s/p anterior STEMI with cutting balloon angioplasty 2001 -  no angina - continue ASA 2. Dyslipidemia - he does not want to take a statin drugy - I will get a copy of his last lipids from Dr. Jarold Motto 3. OSA on CPAP 4. 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 Remote MI  Followup with me in 1 year  Signed, Armanda Magic, MD 07/05/2014 10:16 AM

## 2014-07-05 NOTE — Patient Instructions (Signed)
Your physician has recommended you make the following change in your medication: 1. Decrease Toprol to 25 MG daily  Your physician has requested that you regularly monitor and record your blood pressure readings at home. Please use the same machine at the same time of day to check your readings and record them for one week and call us with the results.  Your physician wants you to follow-up in: 1 year with Dr Sherlyn Lick will receive a reminder letter in the mail two months in advance. If you don't receive a letter, please call our office to schedule the follow-up appointment.

## 2014-07-27 ENCOUNTER — Telehealth: Payer: Self-pay | Admitting: Cardiology

## 2014-07-27 NOTE — Telephone Encounter (Signed)
Spoke with pt and made sure medications were refilled.

## 2014-07-27 NOTE — Telephone Encounter (Signed)
New message           Update from medication change:  BP has been running an average of 128/82 and pt says he is feeling fine.  Pt is about to run out of metoprolol (medication dosage that was changed).

## 2014-07-27 NOTE — Telephone Encounter (Signed)
Fyi to Dr. Mayford Knifeurner.

## 2015-04-24 ENCOUNTER — Encounter: Payer: Self-pay | Admitting: Cardiology

## 2015-05-22 ENCOUNTER — Telehealth: Payer: Self-pay | Admitting: Cardiology

## 2015-05-22 DIAGNOSIS — I2510 Atherosclerotic heart disease of native coronary artery without angina pectoris: Secondary | ICD-10-CM

## 2015-05-22 DIAGNOSIS — I10 Essential (primary) hypertension: Secondary | ICD-10-CM

## 2015-05-22 DIAGNOSIS — E785 Hyperlipidemia, unspecified: Secondary | ICD-10-CM

## 2015-05-22 NOTE — Telephone Encounter (Signed)
Left message to call back  

## 2015-05-22 NOTE — Telephone Encounter (Signed)
FLP, ALT and BMET 

## 2015-05-22 NOTE — Telephone Encounter (Signed)
New Message  Pt req a call back to determine if his labs are needed for his appt in September. If so please place orders

## 2015-05-22 NOTE — Telephone Encounter (Signed)
Pt calling to ask Dr Mayford Knife if she would like for him to have labs done prior to his OV with her on 07/19/15.  Pt states he would like to have his lipids rechecked, for its been a year since he had this done.  Pt states he is ok with any other additional labs she would like for him to have done as well.  Informed the pt that Dr Mayford Knife is out of the office today, but I will route this message to her for further review and recommendation of lab orders and someone will follow-up with him thereafter.  Pt verbalized understanding and agrees with this plan.

## 2015-05-22 NOTE — Telephone Encounter (Signed)
Spoke with pt and informed him that Dr. Mayford Knife would like FLP, ALT and BMET. Scheduled appt for 9/12. Pt verbalized understanding and was in agreement with this plan.

## 2015-06-11 ENCOUNTER — Telehealth: Payer: Self-pay | Admitting: Cardiology

## 2015-06-11 NOTE — Telephone Encounter (Signed)
Walk in pt form-[facility]-paper dropped off gave to [place]/Dr. Mayford Knife

## 2015-06-19 ENCOUNTER — Telehealth: Payer: Self-pay | Admitting: Cardiology

## 2015-06-19 NOTE — Telephone Encounter (Signed)
Informed patient no antibiotics are needed for tooth extraction. Patient agrees with treatment plan.

## 2015-06-19 NOTE — Telephone Encounter (Signed)
F/u   Pt stated he is having a total of 5 teeth removed. Please call pt is any more questions.

## 2015-06-19 NOTE — Telephone Encounter (Signed)
No SBE prophylaxis needed. 

## 2015-06-19 NOTE — Telephone Encounter (Signed)
Left message to call back  

## 2015-06-19 NOTE — Telephone Encounter (Signed)
New message    Pt is to have dental procedure on Sept 6th Pt is wanting to know if he needs to take an antibiotic prior to and after procedure

## 2015-07-01 ENCOUNTER — Other Ambulatory Visit (INDEPENDENT_AMBULATORY_CARE_PROVIDER_SITE_OTHER): Payer: Federal, State, Local not specified - PPO | Admitting: *Deleted

## 2015-07-01 DIAGNOSIS — I10 Essential (primary) hypertension: Secondary | ICD-10-CM | POA: Diagnosis not present

## 2015-07-01 DIAGNOSIS — I2510 Atherosclerotic heart disease of native coronary artery without angina pectoris: Secondary | ICD-10-CM | POA: Diagnosis not present

## 2015-07-01 DIAGNOSIS — E785 Hyperlipidemia, unspecified: Secondary | ICD-10-CM

## 2015-07-01 LAB — LIPID PANEL
CHOL/HDL RATIO: 6
CHOLESTEROL: 143 mg/dL (ref 0–200)
HDL: 25.3 mg/dL — AB (ref >39.00)
LDL CALC: 85 mg/dL (ref 0–99)
NonHDL: 117.91
TRIGLYCERIDES: 167 mg/dL — AB (ref 0.0–149.0)
VLDL: 33.4 mg/dL (ref 0.0–40.0)

## 2015-07-01 LAB — BASIC METABOLIC PANEL
BUN: 16 mg/dL (ref 6–23)
CALCIUM: 9.2 mg/dL (ref 8.4–10.5)
CO2: 28 meq/L (ref 19–32)
CREATININE: 1.05 mg/dL (ref 0.40–1.50)
Chloride: 105 mEq/L (ref 96–112)
GFR: 91.72 mL/min (ref >60.00)
GLUCOSE: 96 mg/dL (ref 70–99)
Potassium: 3.6 mEq/L (ref 3.5–5.1)
Sodium: 142 mEq/L (ref 135–145)

## 2015-07-01 LAB — ALT: ALT: 46 U/L (ref 0–53)

## 2015-07-01 NOTE — Addendum Note (Signed)
Addended by: Tonita Phoenix on: 07/01/2015 07:54 AM   Modules accepted: Orders

## 2015-07-19 ENCOUNTER — Ambulatory Visit (INDEPENDENT_AMBULATORY_CARE_PROVIDER_SITE_OTHER): Payer: Federal, State, Local not specified - PPO | Admitting: Cardiology

## 2015-07-19 ENCOUNTER — Encounter: Payer: Self-pay | Admitting: Cardiology

## 2015-07-19 VITALS — BP 120/78 | HR 50 | Ht 66.75 in | Wt 178.4 lb

## 2015-07-19 DIAGNOSIS — R001 Bradycardia, unspecified: Secondary | ICD-10-CM

## 2015-07-19 DIAGNOSIS — I2583 Coronary atherosclerosis due to lipid rich plaque: Principal | ICD-10-CM

## 2015-07-19 DIAGNOSIS — I2510 Atherosclerotic heart disease of native coronary artery without angina pectoris: Secondary | ICD-10-CM

## 2015-07-19 DIAGNOSIS — I10 Essential (primary) hypertension: Secondary | ICD-10-CM

## 2015-07-19 DIAGNOSIS — E785 Hyperlipidemia, unspecified: Secondary | ICD-10-CM | POA: Diagnosis not present

## 2015-07-19 HISTORY — DX: Bradycardia, unspecified: R00.1

## 2015-07-19 MED ORDER — METOPROLOL SUCCINATE ER 25 MG PO TB24: 12.5000 mg | ORAL_TABLET | Freq: Every day | ORAL | Status: DC

## 2015-07-19 NOTE — Progress Notes (Signed)
Cardiology Office Note   Date:  07/19/2015   ID:  Scott Schwartz, DOB [birth year], MRN [id number]  PCP:  Scott Fillers, MD    Chief Complaint  Patient presents with  . 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 He denies any SOB, DOE, chest pain, dizziness, palpitations or syncope. He has not been on statins due to side effects and "fear of the drug" in the past.He has chronic LE edema which is stable on diuretics. 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 He has noticed some fatigue as well.      Past Medical History  Diagnosis Date  . History of colonoscopy 02/17/2003  . GERD (gastroesophageal reflux disease)   . Hyperlipidemia   . Asthma   . Glaucoma     Borderline  . OSA (obstructive sleep apnea) 09/22/2013  . Claustrophobia 09/22/2013  . Hypertension   . CAD (coronary artery disease) 2001    anterior STEMI 2001 with cutting balloon angioplasty of the mid LAD  . Nephrolithiasis   . Diabetes mellitus without complication   . Asthma     Past Surgical History  Procedure Laterality Date  . Cardiac catheterization    . Colonoscopy  02/2003  . Vasectomy       Current Outpatient Prescriptions  Medication Sig Dispense Refill  . Albuterol (PROVENTIL IN) Inhale 1-2 puffs into the lungs 3 (three) times daily.    Marland Kitchen ALPRAZolam (XANAX) 0.25 MG tablet Take 0.25 mg by mouth at bedtime as needed for anxiety.    Marland Kitchen amLODipine (NORVASC) 5 MG tablet Take 5 mg by mouth daily.    Marland Kitchen aspirin 81 MG chewable tablet Chew 81 mg by mouth daily.    . fluticasone (FLONASE) 50 MCG/ACT nasal spray Place into both nostrils daily.    . folic acid (FOLVITE) 1 MG tablet Take 1 mg by mouth daily.    . hydrochlorothiazide  (HYDRODIURIL) 12.5 MG tablet Take 12.5 mg by mouth daily as needed (For swelling).     Marland Kitchen HYDROcodone-acetaminophen (NORCO/VICODIN) 5-325 MG tablet Take 5-325 tablets by mouth as needed. For pain  0  . Krill Oil 300 MG CAPS Take 350 mg by mouth daily.    Marland Kitchen losartan (COZAAR) 100 MG tablet Take 100 mg by mouth daily.    . metoprolol succinate (TOPROL-XL) 25 MG 24 hr tablet Take 1 tablet (25 mg total) by mouth daily. Take with or immediately following a meal. 30 tablet 11  . montelukast (SINGULAIR) 10 MG tablet Take 10 mg by mouth at bedtime.    . Multiple Vitamin (MULTIVITAMIN) tablet Take 1 tablet by mouth daily.    . nitroGLYCERIN (NITROLINGUAL) 0.4 MG/SPRAY spray Place 1 spray under the tongue every 5 (five) minutes x 3 doses as needed for chest pain.    . sildenafil (VIAGRA) 100 MG tablet Take 100 mg by mouth daily as needed for erectile dysfunction.     No current facility-administered medications for this visit.    Allergies:   Ivp dye    Social History:  The patient  reports that he has quit smoking. His smoking use  included Cigarettes. He quit after 7 years of use. He has never used smokeless tobacco. He reports that he does not drink alcohol or use illicit drugs.   Family History:  The patient's family history includes CAD in his brother; CAD (age of onset: 66) in his father; Leukemia (age of onset: 83) in his mother.    ROS:  Please see the history of present illness.   Otherwise, review of systems are positive for none.   All other systems are reviewed and negative.    PHYSICAL EXAM: VS:  BP 120/78 mmHg  Pulse 50  Ht 5' 6.75" (1.695 m)  Wt 178 lb 6.4 oz (80.922 kg)  BMI 28.17 kg/m2 , BMI Body mass index is 28.17 kg/(m^2). GEN: Well nourished, well developed, in no acute distress HEENT: normal Neck: no JVD, carotid bruits, or masses Cardiac: RRR; no murmurs, rubs, or gallops,no edema  Respiratory:  clear to auscultation bilaterally, normal work of breathing GI: soft,  nontender, nondistended, + BS MS: no deformity or atrophy Skin: warm and dry, no rash Neuro:  Strength and sensation are intact Psych: euthymic mood, full affect   EKG:  EKG is ordered today. The ekg ordered today demonstrates Sinus bradycardia at 50bpm with no ST changes   Recent Labs: 07/01/2015: ALT 46; BUN 16; Creatinine, Ser 1.05; Potassium 3.6; Sodium 142    Lipid Panel    Component Value Date/Time   CHOL 143 07/01/2015 0754   TRIG 167.0* 07/01/2015 0754   HDL 25.30* 07/01/2015 0754   CHOLHDL 6 07/01/2015 0754   VLDL 33.4 07/01/2015 0754   LDLCALC 85 07/01/2015 0754      Wt Readings from Last 3 Encounters:  07/19/15 178 lb 6.4 oz (80.922 kg)  07/05/14 179 lb 6.4 oz (81.375 kg)  09/22/13 177 lb (80.287 kg)    ASSESSMENT AND PLAN:  1. ASCAD s/p anterior STEMI with cutting balloon angioplasty 2001 - no angina - continue ASA/BB 2. Dyslipidemia - he does not want to take a statin drug or Zetia.  He is going to get back into exercise program and recheck in 4 months - I will get a copy of his last lipids from Dr. Jarold Schwartz 3. OSA on CPAP 5. Drug induced sinus bradycardia - I have asked him to cut his Toprol back to 12.5mg  daily 6. Remote MI - continue BB 7. HTN -controlled - continue amlodipine/HCTZ/BB/ARB    Current medicines are reviewed at length with the patient today.  The patient does not have concerns regarding medicines.  The following changes have been made:  no change  Labs/ tests ordered today: See above Assessment and Plan No orders of the defined types were placed in this encounter.     Disposition:   FU with me in 1 years  Signed, Scott Reichert, MD  07/19/2015 11:32 AM    [facility] [address] [place], [place], [place]  [address] Phone: [phone]; Fax: [phone]

## 2015-07-19 NOTE — Patient Instructions (Signed)
Medication Instructions:  Your physician has recommended you make the following change in your medication:  1) DECREASE TOPROL to 12.5 mg daily  Labwork: Your physician recommends that you return for FASTING lab work in: 4 months (LFTs, Lipids)   Testing/Procedures: None  Follow-Up: Your physician wants you to follow-up in: 1 year with Dr. Mayford Knife. You will receive a reminder letter in the mail two months in advance. If you don't receive a letter, please call our office to schedule the follow-up appointment.   Any Other Special Instructions Will Be Listed Below (If Applicable).

## 2015-08-13 ENCOUNTER — Other Ambulatory Visit: Payer: Self-pay | Admitting: Cardiology

## 2015-08-14 ENCOUNTER — Other Ambulatory Visit: Payer: Self-pay | Admitting: *Deleted

## 2015-09-11 ENCOUNTER — Other Ambulatory Visit: Payer: Self-pay | Admitting: *Deleted

## 2015-09-11 MED ORDER — METOPROLOL SUCCINATE ER 25 MG PO TB24: 12.5000 mg | ORAL_TABLET | Freq: Every day | ORAL | Status: DC

## 2015-09-11 MED ORDER — AMLODIPINE BESYLATE 5 MG PO TABS: 5.0000 mg | ORAL_TABLET | Freq: Every day | ORAL | Status: DC

## 2015-09-11 MED ORDER — LOSARTAN POTASSIUM 100 MG PO TABS: 100.0000 mg | ORAL_TABLET | Freq: Every day | ORAL | Status: DC

## 2015-10-07 ENCOUNTER — Institutional Professional Consult (permissible substitution): Payer: Federal, State, Local not specified - PPO | Admitting: Neurology

## 2015-10-08 ENCOUNTER — Encounter: Payer: Self-pay | Admitting: Neurology

## 2015-10-08 ENCOUNTER — Ambulatory Visit (INDEPENDENT_AMBULATORY_CARE_PROVIDER_SITE_OTHER): Payer: Federal, State, Local not specified - PPO | Admitting: Neurology

## 2015-10-08 VITALS — BP 108/70 | HR 60 | Resp 20 | Ht 66.5 in | Wt 179.0 lb

## 2015-10-08 DIAGNOSIS — R0683 Snoring: Secondary | ICD-10-CM | POA: Diagnosis not present

## 2015-10-08 DIAGNOSIS — G4733 Obstructive sleep apnea (adult) (pediatric): Secondary | ICD-10-CM

## 2015-10-08 NOTE — Patient Instructions (Signed)

## 2015-10-08 NOTE — Progress Notes (Signed)
SLEEP MEDICINE CLINIC   Provider:  Larey Seat, M D  Referring Provider: Leanna Battles, MD Primary Care Physician:  Donnajean Lopes, MD  Chief Complaint  Patient presents with  . New Patient (Initial Visit)    pt is on cpap, more than 5 years, wants a new cpap, has had a sleep study in 2013, uses [facility], rm 11, alone    HPI:  Scott Schwartz is a 64 y.o. male , seen here as a referral from Dr. Philip Aspen for a re evaluation of OSA and CPAP needs.  Mr. Scott Schwartz underwent a sleep study on 03-24-2012 which confirmed the presence of sleep apnea at an AHI of 14 and an RDI of 26. During REM sleep he did not have accentuated higher number of apneas but in supine sleep his AHI was 33.8. He had minimum desaturations at the time and was titrated to 8 cm water. Best effective pressures seem to be 7 cm with a flex setting of 2 cm water for the PE are which means expiratory pressure relief. We met shortly after the sleep study and the patient switched from a facial mask to a nasal pillow which he has used since. He states that his machine already originated from 2003 when he was for the first time evaluated, diagnosed with OSA and titrated to CPAP. This machine no longer works for him and he is definitely overdue to receive a new one. The patient's past medical history is positive for diabetes mellitus with renal manifestations, type II.  Coronary artery disease with no angina symptoms, hypertension treatment with amlodipine, losartan, propranolol. Nitroglycerin lingual spray as needed. Hyperlipidemia was started on pravastatin 40 mrem tablets, known sleep apnea history diagnosed for the first time 2003 we diagnosed in 2013 . Chief complaint according to patient : Sleep is fragmented, interrupted and no longer restorative. The patient reports that his wife has noted him to snore again loudly and to have irregular breathing.  Sleep habits are as follows: The patient reports that he usually goes to sleep at  about 11:30 PM most nights he will fall asleep promptly. He does not stay asleep. Reports that he wakes up as early as 4:00 AM spontaneously also he doesn't have to leave the bed at this time. He shares the cool, quiet , dark bedroom  With his wife. In any nights he would only get about 4 hours of sleep. Even in that short period of time he will wake up once or twice at least. And at least one of these breaks is a bathroom break. He does not wake up with headaches, but a very dry mouth. He sleeps on multiple pillows, starting off on his side but usually during the night resumes the supine sleep position. This is also when he likely snores the loudest.   Sleep medical history and family sleep history:  The patient was first diagnosed with sleep apnea over 13 years ago and has been a CPAP user ever since. His weight has been stable. He retired in 2009. He reports his father used to be a loud snorer but was not formally diagnosed with apnea. Neither his brother or his sister. No childhood history of sleepwalking, or night terrors.   Social history:  Remote history of shift work, 15 years , last 20 years of daytime work , Non smoker , Non ETOH, caffeine : 1 soda daily , 1-2 sweet iced tea, no coffee.   Review of Systems: Out of a complete 14 system review, the  patient complains of only the following symptoms, and all other reviewed systems are negative.  Snoring, apnea, fatigue , EDS.   Epworth score 3 , Fatigue severity score 18  , geriatric  depression score at one point   Social History   Social History  . Marital Status: Single    Spouse Name: N/A  . Number of Children: 3  . Years of Education: N/A   Occupational History  . RET     POSTAL   Social History Main Topics  . Smoking status: Former Smoker -- 7 years    Types: Cigarettes  . Smokeless tobacco: Never Used     Comment: Pt Quit 1970's  . Alcohol Use: No  . Drug Use: No  . Sexual Activity: Not on file   Other Topics  Concern  . Not on file   Social History Narrative   Pt married 3 children    1 grandchild   Ret [employer]   Former smoker 1 pack x's 36 yrs    Quit in 1970's    Family History  Problem Relation Age of Onset  . Leukemia Mother 52  . CAD Father 4  . CAD Brother     Past Medical History  Diagnosis Date  . History of colonoscopy 02/17/2003  . GERD (gastroesophageal reflux disease)   . Hyperlipidemia   . Asthma   . Glaucoma     Borderline  . OSA (obstructive sleep apnea) 09/22/2013  . Claustrophobia 09/22/2013  . Hypertension   . CAD (coronary artery disease) 2001    anterior STEMI 2001 with cutting balloon angioplasty of the mid LAD  . Nephrolithiasis   . Diabetes mellitus without complication ([place])   . Asthma   . Bradycardia 07/19/2015  . ED (erectile dysfunction)     Past Surgical History  Procedure Laterality Date  . Cardiac catheterization    . Colonoscopy  02/2003  . Vasectomy      Current Outpatient Prescriptions  Medication Sig Dispense Refill  . Albuterol (PROVENTIL IN) Inhale 1-2 puffs into the lungs 3 (three) times daily.    Marland Kitchen ALPRAZolam (XANAX) 0.25 MG tablet Take 0.25 mg by mouth at bedtime as needed for anxiety.    Marland Kitchen amLODipine (NORVASC) 5 MG tablet Take 1 tablet (5 mg total) by mouth daily. 30 tablet 11  . aspirin 81 MG chewable tablet Chew 81 mg by mouth daily.    . fluticasone (FLONASE) 50 MCG/ACT nasal spray Place into both nostrils daily.    . folic acid (FOLVITE) 1 MG tablet Take 1 mg by mouth daily.    . hydrochlorothiazide (HYDRODIURIL) 12.5 MG tablet Take 12.5 mg by mouth daily as needed (For swelling).     Marland Kitchen HYDROcodone-acetaminophen (NORCO/VICODIN) 5-325 MG tablet Take 5-325 tablets by mouth as needed. For pain  0  . Krill Oil 300 MG CAPS Take 350 mg by mouth daily.    Marland Kitchen losartan (COZAAR) 100 MG tablet Take 1 tablet (100 mg total) by mouth daily. 30 tablet 11  . metoprolol succinate (TOPROL-XL) 25 MG 24 hr tablet Take 0.5 tablets (12.5 mg  total) by mouth daily. (Patient taking differently: Take 25 mg by mouth daily. ) 15 tablet 11  . montelukast (SINGULAIR) 10 MG tablet Take 10 mg by mouth at bedtime.    . Multiple Vitamin (MULTIVITAMIN) tablet Take 1 tablet by mouth daily.    . nitroGLYCERIN (NITROLINGUAL) 0.4 MG/SPRAY spray Place 1 spray under the tongue every 5 (five) minutes x 3 doses  as needed for chest pain.    Marland Kitchen omeprazole (PRILOSEC) 20 MG capsule Take 20 mg by mouth daily.    . propranolol (INDERAL) 10 MG tablet Take 10 mg by mouth 2 (two) times daily as needed.    . sildenafil (VIAGRA) 100 MG tablet Take 100 mg by mouth daily as needed for erectile dysfunction.     No current facility-administered medications for this visit.    Allergies as of 10/08/2015 - Review Complete 10/08/2015  Allergen Reaction Noted  . Ivp dye [iodinated diagnostic agents] Shortness Of Breath 09/22/2013    Vitals: BP 108/70 mmHg  Pulse 60  Resp 20  Ht 5' 6.5" (1.689 m)  Wt 179 lb (81.194 kg)  BMI 28.46 kg/m2 Last Weight:  Wt Readings from Last 1 Encounters:  10/08/15 179 lb (81.194 kg)   WUJ:WJXB mass index is 28.46 kg/(m^2).     Last Height:   Ht Readings from Last 1 Encounters:  10/08/15 5' 6.5" (1.689 m)    Physical exam:  General: The patient is awake, alert and appears not in acute distress. The patient is well groomed. Head: Normocephalic, atraumatic. Neck is supple. Mallampati 3,  neck circumference:16. Nasal airflow unrestricited   Cardiovascular:  Regular rate and rhythm, without  murmurs or carotid bruit, and without distended neck veins. Respiratory: Lungs are clear to auscultation. Skin:  Without evidence of edema, or rash Trunk: . The patient's posture is erect   Neurologic exam : The patient is awake and alert, oriented to place and time.   Memory subjective  described as intact.  Has  dysphonia , not  aphasia.  Mood and affect are appropriate.  Cranial nerves: Pupils are equal and briskly reactive to  light. Funduscopic exam without  evidence of pallor or edema.  Extraocular movements  in vertical and horizontal planes intact and without nystagmus. Visual fields by finger perimetry are intact. Hearing to finger rub intact.   Facial sensation intact to fine touch.  Facial motor strength is symmetric and tongue and uvula move midline. Shoulder shrug was symmetrical.   Motor exam: Normal tone, muscle bulk and symmetric strength in all extremities.  Sensory:  Fine touch, pinprick and vibration were tested in all extremities. Proprioception tested in the upper extremities was normal.  Coordination: Rapid alternating movements in the fingers/hands was normal.  Finger-to-nose maneuver normal without evidence of ataxia, dysmetria or tremor.  Gait and station: Patient walks without assistive device and is able unassisted to climb up to the exam table. Strength within normal limits.  Stance is stable and normal.   Deep tendon reflexes: in the  upper and lower extremities are symmetric and intact. Babinski maneuver response is downgoing.  The patient was advised of the nature of the diagnosed sleep disorder , the treatment options and risks for general a health and wellness arising from not treating the condition.  I spent more than 40  minutes of face to face time with the patient. Greater than 50% of time was spent in counseling and coordination of care. We have discussed the diagnosis and differential and I answered the patient's questions.     Assessment:  After physical and neurologic examination, review of laboratory studies,  Personal review of imaging studies, reports of other /same  Imaging studies ,  Results of polysomnography/ neurophysiology testing and pre-existing records as far as provided in visit., my assessment is   1) Mr. Scott Schwartz  who is officially retired now attends college classes towards a Conservator, museum/gallery. He  is not excessively daytime fatigued or sleepy at this point . if he  takes naps (which he tries to avoid) he usually sleeps longer and feels not refreshed or restored afterwards.  They distract from his nocturnal sleep. For this reason he has cut naps out. We are discussing today his need to be reevaluated for sleep apnea to no current degree of sleep apnea more than 3 years after his last test and also that he states that his machine has been nonfunctioning. The machine of his machines age is not wife I Capable and cannot be read remotely.  This is usually the standard of care for the last 3 years at last. He is due for a new machine and should also be refitted with a comfortable interface. Given that he currently has breakthrough snoring and breakthrough apneas I need to establish a new baseline. For this reason I will ask Mr. Guardado to come to the sleep lab to that he can reestablish his diagnosis, the degree of apnea if found, and the best suitable interface.  Plan:  Treatment plan and additional workup :  SPLIT night polysomnography, refit nasal pillow interface and write for a new machine, Dream station, possible Dream wear.     Asencion Partridge Mel Langan MD  10/08/2015   CC: Leanna Battles, [place], [place] [address]

## 2015-12-13 ENCOUNTER — Ambulatory Visit (INDEPENDENT_AMBULATORY_CARE_PROVIDER_SITE_OTHER): Payer: Federal, State, Local not specified - PPO | Admitting: Neurology

## 2015-12-13 DIAGNOSIS — R0683 Snoring: Secondary | ICD-10-CM

## 2015-12-13 DIAGNOSIS — G4733 Obstructive sleep apnea (adult) (pediatric): Secondary | ICD-10-CM | POA: Diagnosis not present

## 2015-12-14 NOTE — Sleep Study (Signed)
Please see the scanned sleep study interpretation located in the procedure tab within the chart review section.   

## 2015-12-18 ENCOUNTER — Telehealth: Payer: Self-pay

## 2015-12-18 DIAGNOSIS — G4733 Obstructive sleep apnea (adult) (pediatric): Secondary | ICD-10-CM

## 2015-12-18 NOTE — Telephone Encounter (Signed)
Spoke to pt regarding his sleep study results. I advised him that his study revealed mild osa and treatment is advised. PAP therapy is still indicated. Dr. Vickey Huger recommends to proceed with a new mask fitting and cpap titration study. I advised him that alternative therapies may include: oral appliance or ENT evaluation/procedure. Weight loss and positional therapy may be entertained. i advised him to avoid caffeine-containing beverages and chocolate. Pt verbalized understanding. Pt is agreeable to undergoing a cpap titration study. I advised him that our sleep lab would call him to schedule that study. Pt verbalized understanding.

## 2016-01-27 ENCOUNTER — Ambulatory Visit (INDEPENDENT_AMBULATORY_CARE_PROVIDER_SITE_OTHER): Payer: Federal, State, Local not specified - PPO | Admitting: Neurology

## 2016-01-27 DIAGNOSIS — G4733 Obstructive sleep apnea (adult) (pediatric): Secondary | ICD-10-CM | POA: Diagnosis not present

## 2016-01-28 NOTE — Sleep Study (Signed)
Please see the scanned sleep study interpretation located in the Procedure tab within the Chart Review section. 

## 2016-01-30 ENCOUNTER — Telehealth: Payer: Self-pay

## 2016-01-30 DIAGNOSIS — G4733 Obstructive sleep apnea (adult) (pediatric): Secondary | ICD-10-CM

## 2016-01-30 NOTE — Telephone Encounter (Signed)
Spoke to pt regarding his sleep study results. I advised him that his sleep study showed significant improvement with less respiratory events during cpap titration. Dr. Vickey Hugerohmeier recommends starting a cpap. Pt is agreeable. I advised pt that I would send the order to a DME and they would contact the pt for set up. I advised pt to avoid caffeine containing beverages and chocolate. Pt verbalized understanding. A follow up appt was made for 03/30/16 at 11:00. Will send to [facility].

## 2016-03-30 ENCOUNTER — Ambulatory Visit: Payer: Self-pay | Admitting: Neurology

## 2016-04-01 ENCOUNTER — Ambulatory Visit (INDEPENDENT_AMBULATORY_CARE_PROVIDER_SITE_OTHER): Payer: Federal, State, Local not specified - PPO | Admitting: Neurology

## 2016-04-01 ENCOUNTER — Encounter: Payer: Self-pay | Admitting: Neurology

## 2016-04-01 VITALS — BP 110/68 | HR 56 | Resp 20 | Ht 67.0 in | Wt 183.0 lb

## 2016-04-01 DIAGNOSIS — Z9989 Dependence on other enabling machines and devices: Secondary | ICD-10-CM

## 2016-04-01 DIAGNOSIS — G4733 Obstructive sleep apnea (adult) (pediatric): Secondary | ICD-10-CM | POA: Diagnosis not present

## 2016-04-01 NOTE — Progress Notes (Signed)
SLEEP MEDICINE CLINIC   Provider:  Larey Seat, M D  Referring Provider: Leanna Battles, MD Primary Care Physician:  Donnajean Lopes, MD  Chief Complaint  Patient presents with  . Follow-up    cpap, feels more alert, more energized, rm 11, alone    HPI:  Scott Schwartz is a 65 y.o. male , seen here as a referral from Dr. Philip Aspen for a re evaluation of OSA and CPAP needs.  [name] underwent a sleep study on 03-24-2012 which confirmed the presence of sleep apnea at an AHI of 14 and an RDI of 26. During REM sleep he did not have accentuated higher number of apneas but in supine sleep his AHI was 33.8. He had minimum desaturations at the time and was titrated to 8 cm water. Best effective pressures seem to be 7 cm with a flex setting of 2 cm water for the PE are which means expiratory pressure relief. We met shortly after the sleep study and the patient switched from a facial mask to a nasal pillow which he has used since. He states that his machine already originated from 2003 when he was for the first time evaluated, diagnosed with OSA and titrated to CPAP. This machine no longer works for him and he is definitely overdue to receive a new one. The patient's past medical history is positive for diabetes mellitus with renal manifestations, type II.  Coronary artery disease with no angina symptoms, hypertension treatment with amlodipine, losartan, propranolol. Nitroglycerin lingual spray as needed. Hyperlipidemia was started on pravastatin 40 mrem tablets, known sleep apnea history diagnosed for the first time 2003 we diagnosed in 2013 . Chief complaint according to patient : Sleep is fragmented, interrupted and no longer restorative. The patient reports that his wife has noted him to snore again loudly and to have irregular breathing.  Sleep habits are as follows: The patient reports that he usually goes to sleep at about 11:30 PM most nights he will fall asleep promptly. He does not  stay asleep. Reports that he wakes up as early as 4:00 AM spontaneously also he doesn't have to leave the bed at this time. He shares the cool, quiet , dark bedroom  With his wife. In any nights he would only get about 4 hours of sleep. Even in that short period of time he will wake up once or twice at least. And at least one of these breaks is a bathroom break. He does not wake up with headaches, but a very dry mouth. He sleeps on multiple pillows, starting off on his side but usually during the night resumes the supine sleep position. This is also when he likely snores the loudest.  Interval history from 04/01/2016 Mr. Ewers underwent a new sleep study on 12/13/2015, this revealed an AHI of 11, supine AHI is 35 and interestingly in non-REM AHI of 12 over 4 in REM. Positive airway pressure therapy was still indicated and the patient returned for titration on 01/27/2016 the AHI was 0.4 and he tolerated 6 cm water pressure very well. Oxygen nadir was 90%. He also brought me his current compliance report the patient has used the machine 29 of 30 days which is 97% and 25 out of 30 days over 4 hours which is 83% compliance average user time is 5 hours and 11 minutes, set pressure of 6 and an AHI of 1.5. The needs to be no adjustments made. The patient's new machine is working well for him and she also  is using a new interface.  DME [facility], near [facility] .  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   Review of Systems: Out of a complete 14 system review, the patient complains of only the following symptoms, and all other reviewed systems are negative.  Snoring, apnea, fatigue , EDS.   Epworth score 4 , Fatigue severity score 18  , geriatric  depression score at one point   Social History   Social History  . Marital Status: Single    Spouse Name: N/A  . Number of  Children: 3  . Years of Education: N/A   Occupational History  . RET     POST[employer]   Social History Main Topics  . Smoking status: Former Smoker -- 7 years    Types: Cigarettes  . Smokeless tobacco: Never Used     Comment: Pt Quit 1970's  . Alcohol Use: No  . Drug Use: No  . Sexual Activity: Not on file   Other Topics Concern  . Not on file   Social History Narrative   Pt married 3 children    1 grandchild   Ret [employer]   Former smoker 1 pack x's 36 yrs    Quit in 1970's    Family History  Problem Relation Age of Onset  . Leukemia Mother 62  . CAD Father 65  . CAD Brother     Past Medical History  Diagnosis Date  . History of colonoscopy 02/17/2003  . GERD (gastroesophageal reflux disease)   . Hyperlipidemia   . Asthma   . Glaucoma     Borderline  . OSA (obstructive sleep apnea) 09/22/2013  . Claustrophobia 09/22/2013  . Hypertension   . CAD (coronary artery disease) 2001    anterior STEMI 2001 with cutting balloon angioplasty of the mid LAD  . Nephrolithiasis   . Diabetes mellitus without complication ([place])   . Asthma   . Bradycardia 07/19/2015  . ED (erectile dysfunction)     Past Surgical History  Procedure Laterality Date  . Cardiac catheterization    . Colonoscopy  02/2003  . Vasectomy      Current Outpatient Prescriptions  Medication Sig Dispense Refill  . Albuterol (PROVENTIL IN) Inhale 1-2 puffs into the lungs 3 (three) times daily.    Marland Kitchen ALPRAZolam (XANAX) 0.25 MG tablet Take 0.25 mg by mouth at bedtime as needed for anxiety.    Marland Kitchen amLODipine (NORVASC) 5 MG tablet Take 1 tablet (5 mg total) by mouth daily. 30 tablet 11  . aspirin 81 MG chewable tablet Chew 81 mg by mouth daily.    . fluticasone (FLONASE) 50 MCG/ACT nasal spray Place into both nostrils daily.    . folic acid (FOLVITE) 1 MG tablet Take 1 mg by mouth daily.    . hydrochlorothiazide (HYDRODIURIL) 12.5 MG tablet Take 12.5 mg by mouth daily as needed (For swelling).     Marland Kitchen  HYDROcodone-acetaminophen (NORCO/VICODIN) 5-325 MG tablet Take 5-325 tablets by mouth as needed. For pain  0  . Krill Oil 300 MG CAPS Take 350 mg by mouth daily.    Marland Kitchen losartan (COZAAR) 100 MG tablet Take 1 tablet (100 mg total) by mouth daily. 30 tablet 11  . metoprolol succinate (TOPROL-XL) 25 MG 24 hr tablet Take 0.5 tablets (12.5 mg total) by mouth daily. (  Patient taking differently: Take 25 mg by mouth daily. ) 15 tablet 11  . montelukast (SINGULAIR) 10 MG tablet Take 10 mg by mouth at bedtime.    . Multiple Vitamin (MULTIVITAMIN) tablet Take 1 tablet by mouth daily.    . nitroGLYCERIN (NITROLINGUAL) 0.4 MG/SPRAY spray Place 1 spray under the tongue every 5 (five) minutes x 3 doses as needed for chest pain.    Marland Kitchen omeprazole (PRILOSEC) 20 MG capsule Take 20 mg by mouth daily.    . propranolol (INDERAL) 10 MG tablet Take 10 mg by mouth 2 (two) times daily as needed.    . sildenafil (VIAGRA) 100 MG tablet Take 100 mg by mouth daily as needed for erectile dysfunction.     No current facility-administered medications for this visit.    Allergies as of 04/01/2016 - Review Complete 04/01/2016  Allergen Reaction Noted  . Ivp dye [iodinated diagnostic agents] Shortness Of Breath 09/22/2013    Vitals: BP 110/68 mmHg  Pulse 56  Resp 20  Ht '5\' 7"'  (1.702 m)  Wt 183 lb (83.008 kg)  BMI 28.66 kg/m2 Last Weight:  Wt Readings from Last 1 Encounters:  04/01/16 183 lb (83.008 kg)   ZES:PQZR mass index is 28.66 kg/(m^2).     Last Height:   Ht Readings from Last 1 Encounters:  04/01/16 '5\' 7"'  (1.702 m)    Physical exam:  General: The patient is awake, alert and appears not in acute distress. The patient is well groomed. Head: Normocephalic, atraumatic. Neck is supple. Mallampati 3,  neck circumference:16. Nasal airflow unrestricited   Cardiovascular:  Regular rate and rhythm, without  murmurs or carotid bruit, and without distended neck veins. Respiratory: Lungs are clear to  auscultation. Neurologic exam : The patient is awake and alert, oriented to place and time.   Memory subjective  described as intact.  Has  dysphonia , not  aphasia.  Mood and affect are appropriate.  Cranial nerves: Pupils are equal and briskly reactive to light. Funduscopic exam without  evidence of pallor or edema.  Extraocular movements  in vertical and horizontal planes intact and without nystagmus. Visual fields by finger perimetry are intact.  Facial sensation intact to fine touch.  Facial motor strength is symmetric and tongue and uvula move midline. Shoulder shrug was symmetrical.   Motor exam: Normal tone, muscle bulk and symmetric strength in all extremities.  The patient was advised of the nature of the diagnosed sleep disorder , the treatment options and risks for general a health and wellness arising from not treating the condition.  I spent more than 15 minutes of face to face time with the patient. Greater than 50% of time was spent in counseling and coordination of care. We have discussed the diagnosis and differential and I answered the patient's questions.     Assessment:  After physical and neurologic examination, review of laboratory studies,  Personal review of imaging studies, reports of other /same  Imaging studies ,  Results of polysomnography/ neurophysiology testing and pre-existing records as far as provided in visit., my assessment is   1) continue CPAP use at 6 cm with EPR of 3  for treatment of OSA,   RV in 12 month   Asencion Partridge Thai Hemrick MD  04/01/2016   CC: Leanna Battles, [place], [place] [address]

## 2016-07-03 ENCOUNTER — Encounter: Payer: Self-pay | Admitting: Cardiology

## 2016-07-16 ENCOUNTER — Encounter: Payer: Self-pay | Admitting: Cardiology

## 2016-07-16 ENCOUNTER — Ambulatory Visit (INDEPENDENT_AMBULATORY_CARE_PROVIDER_SITE_OTHER): Payer: Federal, State, Local not specified - PPO | Admitting: Cardiology

## 2016-07-16 VITALS — BP 126/76 | HR 54 | Ht 67.0 in | Wt 171.8 lb

## 2016-07-16 DIAGNOSIS — I2510 Atherosclerotic heart disease of native coronary artery without angina pectoris: Secondary | ICD-10-CM

## 2016-07-16 DIAGNOSIS — I10 Essential (primary) hypertension: Secondary | ICD-10-CM | POA: Diagnosis not present

## 2016-07-16 DIAGNOSIS — E785 Hyperlipidemia, unspecified: Secondary | ICD-10-CM | POA: Diagnosis not present

## 2016-07-16 DIAGNOSIS — I2583 Coronary atherosclerosis due to lipid rich plaque: Principal | ICD-10-CM

## 2016-07-16 LAB — HEPATIC FUNCTION PANEL
ALT: 37 U/L (ref 9–46)
AST: 27 U/L (ref 10–35)
Albumin: 4.3 g/dL (ref 3.6–5.1)
Alkaline Phosphatase: 53 U/L (ref 40–115)
Bilirubin, Direct: 0.1 mg/dL (ref <=0.2)
Indirect Bilirubin: 0.4 mg/dL (ref 0.2–1.2)
Total Bilirubin: 0.5 mg/dL (ref 0.2–1.2)
Total Protein: 7 g/dL (ref 6.1–8.1)

## 2016-07-16 LAB — LIPID PANEL
Cholesterol: 137 mg/dL (ref 125–200)
HDL: 29 mg/dL — ABNORMAL LOW (ref >=40)
LDL Cholesterol: 86 mg/dL (ref <130)
Total CHOL/HDL Ratio: 4.7 Ratio (ref <=5.0)
Triglycerides: 111 mg/dL (ref <150)
VLDL: 22 mg/dL (ref <30)

## 2016-07-16 NOTE — Progress Notes (Signed)
Cardiology Office Note    Date:  07/16/2016   ID:  Scott Schwartz, DOB [birth year], MRN [id number]  PCP:  Donnajean Lopes, MD  Cardiologist:  Fransico Him, MD   Chief Complaint  Patient presents with  . Coronary Artery Disease  . Hypertension  . Hyperlipidemia    History of Present Illness:  Scott Schwartz is a 65 y.o. male with a history fo ASCAD and remote MI in 2002 , OSA on CPAP, GERD, HTN and dyslipidemia who presents today for followup He is doing well. He denies any SOB, DOE, dizziness, palpitations, claudication or syncope. He had a few episodes of chest pressure around a very emotionally stressful time that was similar to his angina in the past.  It only lasted a minutes and resolved with deep breathing.  He has not been on statins due to side effects and "fear of the drug" in the past.He has chronic LE edema which is stable on diuretics. He has not been exercising recently but plans on getting back into exercise.   Past Medical History:  Diagnosis Date  . Asthma   . Bradycardia 07/19/2015  . CAD (coronary artery disease) 2001   anterior STEMI 2001 with cutting balloon angioplasty of the mid LAD   . Claustrophobia 09/22/2013  . Diabetes mellitus without complication ([place])   . ED (erectile dysfunction)   . GERD (gastroesophageal reflux disease)   . Glaucoma    Borderline  . History of colonoscopy 02/17/2003  . Hyperlipidemia   . Hypertension   . Nephrolithiasis   . OSA (obstructive sleep apnea) 09/22/2013    Past Surgical History:  Procedure Laterality Date  . CARDIAC CATHETERIZATION    . COLONOSCOPY  02/2003  . VASECTOMY      Current Medications: Outpatient Medications Prior to Visit  Medication Sig Dispense Refill  . Albuterol (PROVENTIL IN) Inhale 1-2 puffs into the lungs 3 (three) times daily.    Marland Kitchen ALPRAZolam (XANAX) 0.25 MG tablet Take 0.25 mg by mouth at bedtime as needed for anxiety.    Marland Kitchen amLODipine (NORVASC) 5 MG tablet Take 1 tablet (5 mg total)  by mouth daily. 30 tablet 11  . aspirin 81 MG chewable tablet Chew 81 mg by mouth daily.    . fluticasone (FLONASE) 50 MCG/ACT nasal spray Place into both nostrils daily. Use as directed    . folic acid (FOLVITE) 1 MG tablet Take 1 mg by mouth daily.    . hydrochlorothiazide (HYDRODIURIL) 12.5 MG tablet Take 12.5 mg by mouth daily as needed (For swelling).     Marland Kitchen HYDROcodone-acetaminophen (NORCO/VICODIN) 5-325 MG tablet Take 5-325 tablets by mouth as directed. Use as directed. For pain  0  . losartan (COZAAR) 100 MG tablet Take 1 tablet (100 mg total) by mouth daily. 30 tablet 11  . metoprolol succinate (TOPROL-XL) 25 MG 24 hr tablet Take 0.5 tablets (12.5 mg total) by mouth daily. (Patient taking differently: Take 25 mg by mouth daily. ) 15 tablet 11  . montelukast (SINGULAIR) 10 MG tablet Take 10 mg by mouth at bedtime.    . Multiple Vitamin (MULTIVITAMIN) tablet Take 1 tablet by mouth daily.    . nitroGLYCERIN (NITROLINGUAL) 0.4 MG/SPRAY spray Place 1 spray under the tongue every 5 (five) minutes x 3 doses as needed for chest pain.    Marland Kitchen omeprazole (PRILOSEC) 20 MG capsule Take 20 mg by mouth daily.    . propranolol (INDERAL) 10 MG tablet Take 10 mg by mouth 2 (two) times daily as  needed. Use as directed    . sildenafil (VIAGRA) 100 MG tablet Take 100 mg by mouth daily as needed for erectile dysfunction.    Javier Docker Oil 300 MG CAPS Take 350 mg by mouth daily.     No facility-administered medications prior to visit.      Allergies:   Ivp dye [iodinated diagnostic agents]   Social History   Social History  . Marital status: Single    Spouse name: N/A  . Number of children: 3  . Years of education: N/A   Occupational History  . RET     POSTAL   Social History Main Topics  . Smoking status: Former Smoker    Years: 7.00    Types: Cigarettes  . Smokeless tobacco: Never Used     Comment: Pt Quit 1970's  . Alcohol use No  . Drug use: No  . Sexual activity: Not Asked   Other Topics  Concern  . None   Social History Narrative   Pt married 3 children    1 grandchild   Ret [employer]   Former smoker 1 pack x's 36 yrs    Quit in 40's     Family History:  The patient's family history includes CAD in his brother; CAD (age of onset: 82) in his father; Leukemia (age of onset: 54) in his mother.   ROS:   Please see the history of present illness.    ROS All other systems reviewed and are negative.  No flowsheet data found.     PHYSICAL EXAM:   VS:  BP 126/76   Pulse (!) 54   Ht _0  (1.702 m)   Wt 171 lb 12.8 oz (77.9 kg)   SpO2 97%   BMI 26.91 kg/m    GEN: Well nourished, well developed, in no acute distress  HEENT: normal  Neck: no JVD, carotid bruits, or masses Cardiac: RRR; no murmurs, rubs, or gallops,no edema.  Intact distal pulses bilaterally.  Respiratory:  clear to auscultation bilaterally, normal work of breathing GI: soft, nontender, nondistended, + BS MS: no deformity or atrophy  Skin: warm and dry, no rash Neuro:  Alert and Oriented x 3, Strength and sensation are intact Psych: euthymic mood, full affect  Wt Readings from Last 3 Encounters:  07/16/16 171 lb 12.8 oz (77.9 kg)  04/01/16 183 lb (83 kg)  10/08/15 179 lb (81.2 kg)      Studies/Labs Reviewed:   EKG:  EKG is ordered today.  The ekg ordered today demonstrates sinus bradycardia at 54bpm with no ST changes  Recent Labs: No results found for requested labs within last 8760 hours.   Lipid Panel    Component Value Date/Time   CHOL 143 07/01/2015 0754   TRIG 167.0 (H) 07/01/2015 0754   HDL 25.30 (L) 07/01/2015 0754   CHOLHDL 6 07/01/2015 0754   VLDL 33.4 07/01/2015 0754   LDLCALC 85 07/01/2015 0754    Additional studies/ records that were reviewed today include:  none    ASSESSMENT:    1. Coronary artery disease due to lipid rich plaque   2. Benign essential HTN   3. Dyslipidemia      PLAN:  In order of problems listed above:  1. ASCAD with remote  anterior STEMI with cutting balloon angioplasty of the LAD.  He had a few episodes of brief chest pressure around significant emotional issues but only lasted a few seconds and resolved with deep breathing.  He has had not  had any other anginal symptoms.  Continue ASA and BB. 2. HTN - BP controlled on current meds.  Continue amlodipine/diuretic/ARB/BB.  Check BMET. 3. Hyperlipidemia with LDL goal < 70.  Check FLP and ALT.  His last LDL a year ago was 85.  He refuses to take statins.      Medication Adjustments/Labs and Tests Ordered: Current medicines are reviewed at length with the patient today.  Concerns regarding medicines are outlined above.  Medication changes, Labs and Tests ordered today are listed in the Patient Instructions below.  There are no Patient Instructions on file for this visit.   Signed, Fransico Him, MD  07/16/2016 11:08 AM    [address], [place], [place]  [address] Phone: [phone]; Fax: [phone]

## 2016-07-16 NOTE — Patient Instructions (Signed)
Medication Instructions:  Your physician recommends that you continue on your current medications as directed. Please refer to the Current Medication list given to you today.   Labwork: TODAY: LFTs, Lipids  Testing/Procedures: None  Follow-Up: Your physician wants you to follow-up in: 1 year with Dr. Turner. You will receive a reminder letter in the mail two months in advance. If you don't receive a letter, please call our office to schedule the follow-up appointment.   Any Other Special Instructions Will Be Listed Below (If Applicable).     If you need a refill on your cardiac medications before your next appointment, please call your pharmacy.   

## 2016-07-20 ENCOUNTER — Other Ambulatory Visit: Payer: Self-pay | Admitting: *Deleted

## 2016-07-20 DIAGNOSIS — I2583 Coronary atherosclerosis due to lipid rich plaque: Principal | ICD-10-CM

## 2016-07-20 DIAGNOSIS — E785 Hyperlipidemia, unspecified: Secondary | ICD-10-CM

## 2016-07-20 DIAGNOSIS — I2510 Atherosclerotic heart disease of native coronary artery without angina pectoris: Secondary | ICD-10-CM

## 2016-07-20 MED ORDER — EZETIMIBE 10 MG PO TABS: 10.0000 mg | ORAL_TABLET | Freq: Every day | ORAL | 11 refills | Status: DC

## 2016-09-15 ENCOUNTER — Other Ambulatory Visit: Payer: Federal, State, Local not specified - PPO

## 2016-09-19 ENCOUNTER — Other Ambulatory Visit: Payer: Self-pay | Admitting: Cardiology

## 2016-11-18 DIAGNOSIS — I889 Nonspecific lymphadenitis, unspecified: Secondary | ICD-10-CM | POA: Insufficient documentation

## 2017-04-01 ENCOUNTER — Ambulatory Visit: Payer: Federal, State, Local not specified - PPO | Admitting: Neurology

## 2017-06-15 ENCOUNTER — Ambulatory Visit (INDEPENDENT_AMBULATORY_CARE_PROVIDER_SITE_OTHER): Payer: Federal, State, Local not specified - PPO | Admitting: Neurology

## 2017-06-15 ENCOUNTER — Encounter: Payer: Self-pay | Admitting: Neurology

## 2017-06-15 VITALS — BP 124/77 | HR 48 | Ht 66.0 in | Wt 175.0 lb

## 2017-06-15 DIAGNOSIS — G4733 Obstructive sleep apnea (adult) (pediatric): Secondary | ICD-10-CM

## 2017-06-15 DIAGNOSIS — Z7189 Other specified counseling: Secondary | ICD-10-CM

## 2017-06-15 DIAGNOSIS — Z9989 Dependence on other enabling machines and devices: Secondary | ICD-10-CM

## 2017-06-15 NOTE — Progress Notes (Signed)
SLEEP MEDICINE CLINIC   Provider:  Larey Seat, M D  Referring Provider: Leanna Battles, MD Primary Care Physician:  Leanna Battles, MD  Chief Complaint  Patient presents with  . Follow-up    cpap is working well.     HPI:  Scott Schwartz is a 66 y.o. male , seen here as a referral from Dr. Philip Aspen for a re evaluation of OSA and CPAP needs.   06/15/2017 ; The patient is now using an auto titration device , but it is set at 6 cm water , 3 cm EPR. AHI 1.7 but compliance is  43 % only. He works as a Theme park manager, and laughingly replied" there is no part time pastoral care " . 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 He is retired from [employer].  He sleeps well on CPAP, sound and does not snore.    Scott Schwartz underwent a sleep study on 03-24-2012 which confirmed the presence of sleep apnea at an AHI of 14 and an RDI of 26. During REM sleep he did not have accentuated higher number of apneas but in supine sleep his AHI was 33.8. He had minimum desaturations at the time and was titrated to 8 cm water. Best effective pressures seem to be 7 cm with a flex setting of 2 cm water for the PE are which means expiratory pressure relief. We met shortly after the sleep study and the patient switched from a facial mask to a nasal pillow which he has used since. He states that his machine already originated from 2003 when he was for the first time evaluated, diagnosed with OSA and titrated to CPAP. This machine no longer works for him and he is definitely overdue to receive a new one. The patient's past medical history is positive for diabetes mellitus with renal manifestations, type II.  Coronary artery disease with no angina symptoms, hypertension treatment with amlodipine, losartan, propranolol. Nitroglycerin lingual spray as needed. Hyperlipidemia was started on pravastatin 40 mrem tablets, known sleep apnea history diagnosed for the first time 2003 we diagnosed in 2013 . Chief  complaint according to patient : Sleep is fragmented, interrupted and no longer restorative. The patient reports that his wife has noted him to snore again loudly and to have irregular breathing.  Sleep habits are as follows: The patient reports that he usually goes to sleep at about 11:30 PM most nights he will fall asleep promptly. He does not stay asleep. Reports that he wakes up as early as 4:00 AM spontaneously also he doesn't have to leave the bed at this time. He shares the cool, quiet , dark bedroom  With his wife. In any nights he would only get about 4 hours of sleep. Even in that short period of time he will wake up once or twice at least. And at least one of these breaks is a bathroom break. He does not wake up with headaches, but a very dry mouth. He sleeps on multiple pillows, starting off on his side but usually during the night resumes the supine sleep position. This is also when he likely snores the loudest.  Interval history from 04/01/2016 Scott Schwartz underwent a new sleep study on 12/13/2015, this revealed an AHI of 11, supine AHI is 35 and interestingly in non-REM AHI of 12 over 4 in REM. Positive airway pressure therapy was still indicated and the patient returned for titration on 01/27/2016 the AHI was 0.4 and he tolerated 6 cm water  pressure very well. Oxygen nadir was 90%. He also brought me his current compliance report the patient has used the machine 29 of 30 days which is 97% and 25 out of 30 days over 4 hours which is 83% compliance average user time is 5 hours and 11 minutes, set pressure of 6 and an AHI of 1.5. The needs to be no adjustments made. The patient's new machine is working well for him and she also is using a new interface.  DME [facility], near [facility] . 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   Review of Systems: Out of a  complete 14 system review, the patient complains of only the following symptoms, and all other reviewed systems are negative.  Snoring, apnea, fatigue , EDS, no SOB, coughing or sinus trouble.. Averages 6 hours of nightly sleep.   Epworth score 7 , Fatigue severity score 14 , geriatric depression score at 1/15 points   Social History   Social History  . Marital status: Single    Spouse name: N/A  . Number of children: 3  . Years of education: N/A   Occupational History  . RET     POSTAL   Social History Main Topics  . Smoking status: Former Smoker    Years: 7.00    Types: Cigarettes  . Smokeless tobacco: Never Used     Comment: Pt Quit 1970's  . Alcohol use No  . Drug use: No  . Sexual activity: Not on file   Other Topics Concern  . Not on file   Social History Narrative   Pt married 3 children    1 grandchild   Ret [employer]   Former smoker 1 pack x's 36 yrs    Quit in 1970's    Family History  Problem Relation Age of Onset  . Leukemia Mother 61  . CAD Father 82  . CAD Brother     Past Medical History:  Diagnosis Date  . Asthma   . Bradycardia 07/19/2015  . CAD (coronary artery disease) 2001   anterior STEMI 2001 with cutting balloon angioplasty of the mid LAD   . Claustrophobia 09/22/2013  . Diabetes mellitus without complication ([place])   . ED (erectile dysfunction)   . GERD (gastroesophageal reflux disease)   . Glaucoma    Borderline  . History of colonoscopy 02/17/2003  . Hyperlipidemia   . Hypertension   . Nephrolithiasis   . OSA (obstructive sleep apnea) 09/22/2013    Past Surgical History:  Procedure Laterality Date  . CARDIAC CATHETERIZATION    . COLONOSCOPY  02/2003  . VASECTOMY      Current Outpatient Prescriptions  Medication Sig Dispense Refill  . Albuterol (PROVENTIL IN) Inhale 1-2 puffs into the lungs 3 (three) times daily.    Marland Kitchen ALPRAZolam (XANAX) 0.25 MG tablet Take 0.25 mg by mouth at bedtime as needed for anxiety.    Marland Kitchen  amLODipine (NORVASC) 5 MG tablet TAKE ONE TABLET BY MOUTH ONCE DAILY 30 tablet 9  . aspirin 81 MG chewable tablet Chew 81 mg by mouth daily.    . fluticasone (FLONASE) 50 MCG/ACT nasal spray Place into both nostrils daily. Use as directed    . folic acid (FOLVITE) 1 MG tablet Take 1 mg by mouth daily.    Marland Kitchen GARCINIA CAMBOGIA-CHROMIUM PO Take  1 capsule by mouth daily.    . hydrochlorothiazide (HYDRODIURIL) 12.5 MG tablet Take 12.5 mg by mouth daily as needed (For swelling).     Marland Kitchen HYDROcodone-acetaminophen (NORCO/VICODIN) 5-325 MG tablet Take 5-325 tablets by mouth as directed. Use as directed. For pain  0  . Krill Oil 350 MG CAPS Take 1 capsule by mouth daily.    Marland Kitchen losartan (COZAAR) 100 MG tablet TAKE ONE TABLET BY MOUTH ONCE DAILY 30 tablet 9  . metoprolol succinate (TOPROL-XL) 25 MG 24 hr tablet Take 0.5 tablets (12.5 mg total) by mouth daily. (Patient taking differently: Take 25 mg by mouth daily. ) 15 tablet 11  . Misc Natural Products (TART CHERRY ADVANCED PO) Take 1 capsule by mouth daily.    . montelukast (SINGULAIR) 10 MG tablet Take 10 mg by mouth at bedtime.    . Multiple Vitamin (MULTIVITAMIN) tablet Take 1 tablet by mouth daily.    . nitroGLYCERIN (NITROLINGUAL) 0.4 MG/SPRAY spray Place 1 spray under the tongue every 5 (five) minutes x 3 doses as needed for chest pain.    Marland Kitchen omeprazole (PRILOSEC) 20 MG capsule Take 20 mg by mouth daily.    . propranolol (INDERAL) 10 MG tablet Take 10 mg by mouth 2 (two) times daily as needed. Use as directed    . sildenafil (VIAGRA) 100 MG tablet Take 100 mg by mouth daily as needed for erectile dysfunction.    Marland Kitchen ezetimibe (ZETIA) 10 MG tablet Take 1 tablet (10 mg total) by mouth daily. 30 tablet 11   No current facility-administered medications for this visit.     Allergies as of 06/15/2017 - Review Complete 07/16/2016  Allergen Reaction Noted  . Ivp dye [iodinated diagnostic agents] Shortness Of Breath 09/22/2013    Vitals: BP 124/77   Pulse  (!) 48   Ht _0  (1.676 m)   Wt 175 lb (79.4 kg)   BMI 28.25 kg/m  Last Weight:  Wt Readings from Last 1 Encounters:  06/15/17 175 lb (79.4 kg)   VFI:EPPI mass index is 28.25 kg/m.     Last Height:   Ht Readings from Last 1 Encounters:  06/15/17 _1  (1.676 m)    Physical exam:  General: The patient is awake, alert and appears not in acute distress. The patient is well groomed. Head: Normocephalic, atraumatic. Neck is supple. Mallampati 3,  neck circumference:16. Nasal airflow unrestricited   Cardiovascular:  Regular rate and rhythm, without  murmurs or carotid bruit, and without distended neck veins. Respiratory: Lungs are clear to auscultation. Neurologic exam : The patient is awake and alert, oriented to place and time.   Memory subjective  described as intact.  Has mild  Dysphonia ( hoarseness)  , not  aphasia. Reports no dysphagia. Mood and affect are appropriate, calm and content.  Cranial nerves: Pupils are equal and briskly reactive to light. Funduscopic exam without  evidence of pallor or edema. Extraocular movements  in vertical and horizontal planes intact and without nystagmus. Visual fields by finger perimetry are intact.Facial sensation intact to fine touch. Facial motor strength is symmetric and tongue and uvula move midline. Shoulder shrug was symmetrical.   Motor exam: Normal tone, muscle bulk and symmetric strength in all extremities.  The patient was advised of the nature of the diagnosed sleep disorder , the treatment options and risks for general a health and wellness arising from not treating the condition.  I spent more than 15 minutes of face to face time with the patient.  Greater than 50% of time was spent in counseling and coordination of care. We have discussed the diagnosis and differential and I answered the patient's questions.     Assessment:  After physical and neurologic examination, review of laboratory studies,  Personal review of imaging  studies, reports of other /same  Imaging studies.  Results of polysomnography/ neurophysiology testing and pre-existing records as far as provided in visit., my assessment is   1) continue CPAP use at 6 cm with EPR of 3  for treatment of OSA, he reached an AHI of 1.7 , very good. Compliance had dropped in August 2018, but was better in June and July. I asked for a 90 day download.  Keeps his mask , nasal pillows.   RV in 69 month   Asencion Partridge Diavian Furgason MD  06/15/2017   CC: Leanna Battles, [address], [place] [id number]   I spent 15 minutes in total face-to-face time with the patient, more than 50% of which was spent in counseling and coordination of care, reviewing test results, reviewing medication and discussing or reviewing the diagnosis of OSA, its prognosis and treatment options.  Pertinent laboratory and imaging test results that were available during this visit with the patient were reviewed by me and considered in my medical decision making (see chart for details).

## 2017-06-15 NOTE — Patient Instructions (Addendum)
Please remember to try to maintain good sleep hygiene, which means: Keep a regular sleep and wake schedule, try not to exercise or have a meal within 2 hours of your bedtime, try to keep your bedroom conducive for sleep, that is, cool and dark, without light distractors such as an illuminated alarm clock, and refrain from watching TV right before sleep or in the middle of the night and do not keep the TV or radio on during the night. Also, try not to use or play on electronic devices at bedtime, such as your cell phone, tablet PC or laptop. If you like to read at bedtime on an electronic device, try to dim the background light as much as possible. Do not eat in the middle of the night.     

## 2017-07-17 ENCOUNTER — Other Ambulatory Visit: Payer: Self-pay | Admitting: Cardiology

## 2017-08-11 ENCOUNTER — Encounter (HOSPITAL_COMMUNITY): Payer: Self-pay | Admitting: Emergency Medicine

## 2017-08-11 ENCOUNTER — Emergency Department (HOSPITAL_COMMUNITY)
Admission: EM | Admit: 2017-08-11 | Discharge: 2017-08-11 | Disposition: A | Discharge status: Home / Self Care | Payer: Federal, State, Local not specified - PPO | Attending: Physician Assistant | Admitting: Physician Assistant

## 2017-08-11 ENCOUNTER — Emergency Department (HOSPITAL_COMMUNITY): Payer: Federal, State, Local not specified - PPO

## 2017-08-11 DIAGNOSIS — Z79899 Other long term (current) drug therapy: Secondary | ICD-10-CM | POA: Diagnosis not present

## 2017-08-11 DIAGNOSIS — Z23 Encounter for immunization: Secondary | ICD-10-CM | POA: Diagnosis not present

## 2017-08-11 DIAGNOSIS — J45909 Unspecified asthma, uncomplicated: Secondary | ICD-10-CM | POA: Insufficient documentation

## 2017-08-11 DIAGNOSIS — Z87891 Personal history of nicotine dependence: Secondary | ICD-10-CM | POA: Diagnosis not present

## 2017-08-11 DIAGNOSIS — W0110XA Fall on same level from slipping, tripping and stumbling with subsequent striking against unspecified object, initial encounter: Secondary | ICD-10-CM | POA: Diagnosis not present

## 2017-08-11 DIAGNOSIS — S060X0A Concussion without loss of consciousness, initial encounter: Secondary | ICD-10-CM | POA: Insufficient documentation

## 2017-08-11 DIAGNOSIS — Z7982 Long term (current) use of aspirin: Secondary | ICD-10-CM | POA: Diagnosis not present

## 2017-08-11 DIAGNOSIS — Y9301 Activity, walking, marching and hiking: Secondary | ICD-10-CM | POA: Diagnosis not present

## 2017-08-11 DIAGNOSIS — Y999 Unspecified external cause status: Secondary | ICD-10-CM | POA: Insufficient documentation

## 2017-08-11 DIAGNOSIS — E119 Type 2 diabetes mellitus without complications: Secondary | ICD-10-CM | POA: Diagnosis not present

## 2017-08-11 DIAGNOSIS — W19XXXA Unspecified fall, initial encounter: Secondary | ICD-10-CM

## 2017-08-11 DIAGNOSIS — Y929 Unspecified place or not applicable: Secondary | ICD-10-CM | POA: Insufficient documentation

## 2017-08-11 DIAGNOSIS — I10 Essential (primary) hypertension: Secondary | ICD-10-CM | POA: Diagnosis not present

## 2017-08-11 DIAGNOSIS — S0990XA Unspecified injury of head, initial encounter: Secondary | ICD-10-CM | POA: Diagnosis present

## 2017-08-11 DIAGNOSIS — I2510 Atherosclerotic heart disease of native coronary artery without angina pectoris: Secondary | ICD-10-CM | POA: Insufficient documentation

## 2017-08-11 MED ORDER — BACITRACIN ZINC 500 UNIT/GM EX OINT
TOPICAL_OINTMENT | CUTANEOUS | Status: AC
  Administered 2017-08-11: 1
  Filled 2017-08-11: qty 0.9

## 2017-08-11 MED ORDER — TETANUS-DIPHTH-ACELL PERTUSSIS 5-2.5-18.5 LF-MCG/0.5 IM SUSP
0.5000 mL | Freq: Once | INTRAMUSCULAR | Status: AC
  Administered 2017-08-11: 0.5 mL via INTRAMUSCULAR
  Filled 2017-08-11: qty 0.5

## 2017-08-11 NOTE — ED Notes (Signed)
Pt ambulatory and independent at discharge.  Verbalized understanding of discharge instructions 

## 2017-08-11 NOTE — ED Triage Notes (Signed)
Pt c/o a head injury related to a fall outside while he was walking backwards from an unknown dog that was aggressive. He remembers the fall, denies LOC, bleeding is controlled. Pt has 2 superficial abrasions to head and hematoma to occipital/temporal part of skull. Pt is CAOx4 in triage. Denies all other trauma or pain. Pt is ambulatory.

## 2017-08-11 NOTE — Discharge Instructions (Signed)
Please return with any concerns. °

## 2017-08-11 NOTE — ED Provider Notes (Signed)
[facility]-EMERGENCY DEPT Provider Note   CSN: [id number] Arrival date & time: 08/11/17  1613     History   Chief Complaint Chief Complaint  Patient presents with  . Fall  . Head Injury    HPI Scott Schwartz is a 66 y.o. male.  HPI   Is a very pleasant 66 year old male who had a mechanical fall today.  He is running away from a dog and struck his head on the concrete.  Patient had no loss of consciousness.  Did not get attacked by the dog.  Patient has abrasion to the back of his head.  Mild headache.  Past Medical History:  Diagnosis Date  . Asthma   . Bradycardia 07/19/2015  . CAD (coronary artery disease) 2001   anterior STEMI 2001 with cutting balloon angioplasty of the mid LAD   . Claustrophobia 09/22/2013  . Diabetes mellitus without complication ([facility])   . ED (erectile dysfunction)   . GERD (gastroesophageal reflux disease)   . Glaucoma    Borderline  . History of colonoscopy 02/17/2003  . Hyperlipidemia   . Hypertension   . Nephrolithiasis   . OSA (obstructive sleep apnea) 09/22/2013    Patient Active Problem List   Diagnosis Date Noted  . CPAP use counseling 06/15/2017  . OSA on CPAP 04/01/2016  . Bradycardia 07/19/2015  . Edema extremities 07/05/2014  . Benign essential HTN 07/05/2014  . Dyslipidemia 07/05/2014  . CAD (coronary artery disease)   . Nephrolithiasis   . Diabetes mellitus without complication ([facility])   . Asthma   . Claustrophobia 09/22/2013    Past Surgical History:  Procedure Laterality Date  . CARDIAC CATHETERIZATION    . COLONOSCOPY  02/2003  . VASECTOMY         Home Medications    Prior to Admission medications   Medication Sig Start Date End Date Taking? Authorizing Provider  Albuterol (PROVENTIL IN) Inhale 1-2 puffs into the lungs 3 (three) times daily.    [provider]  ALPRAZolam Prudy Feeler) 0.25 MG tablet Take 0.25 mg by mouth at bedtime as needed for anxiety.    [provider]    amLODipine (NORVASC) 5 MG tablet TAKE ONE TABLET BY MOUTH ONCE DAILY 07/19/17   Quintella Reichert, MD  aspirin 81 MG chewable tablet Chew 81 mg by mouth daily.    [provider]  ezetimibe (ZETIA) 10 MG tablet Take 1 tablet (10 mg total) by mouth daily. 07/20/16 10/18/16  Quintella Reichert, MD  fluticasone (FLONASE) 50 MCG/ACT nasal spray Place into both nostrils daily. Use as directed    [provider]  folic acid (FOLVITE) 1 MG tablet Take 1 mg by mouth daily.    [provider]  GARCINIA CAMBOGIA-CHROMIUM PO Take 1 capsule by mouth daily.    [provider]  hydrochlorothiazide (HYDRODIURIL) 12.5 MG tablet Take 12.5 mg by mouth daily as needed (For swelling).  06/15/14   [provider]  HYDROcodone-acetaminophen (NORCO/VICODIN) 5-325 MG tablet Take 5-325 tablets by mouth as directed. Use as directed. For pain 06/25/15   [provider]  Boris Lown Oil 350 MG CAPS Take 1 capsule by mouth daily.    [provider]  losartan (COZAAR) 100 MG tablet TAKE ONE TABLET BY MOUTH ONCE DAILY 07/19/17   Quintella Reichert, MD  metoprolol succinate (TOPROL-XL) 25 MG 24 hr tablet Take 0.5 tablets (12.5 mg total) by mouth daily. Patient taking differently: Take 25 mg by mouth daily.  09/11/15  Quintella Reichert, MD  Misc Natural Products (TART CHERRY ADVANCED PO) Take 1 capsule by mouth daily.    [provider]  montelukast (SINGULAIR) 10 MG tablet Take 10 mg by mouth at bedtime.    [provider]  Multiple Vitamin (MULTIVITAMIN) tablet Take 1 tablet by mouth daily.    [provider]  nitroGLYCERIN (NITROLINGUAL) 0.4 MG/SPRAY spray Place 1 spray under the tongue every 5 (five) minutes x 3 doses as needed for chest pain.    [provider]  omeprazole (PRILOSEC) 20 MG capsule Take 20 mg by mouth daily.    [provider]  propranolol (INDERAL) 10 MG tablet Take 10 mg by mouth 2 (two) times daily as needed. Use as  directed    [provider]  sildenafil (VIAGRA) 100 MG tablet Take 100 mg by mouth daily as needed for erectile dysfunction.    [provider]    Family History Family History  Problem Relation Age of Onset  . Leukemia Mother 41  . CAD Father 19  . CAD Brother     Social History Social History  Substance Use Topics  . Smoking status: Former Smoker    Years: 7.00    Types: Cigarettes  . Smokeless tobacco: Never Used     Comment: Pt Quit 1970's  . Alcohol use No     Allergies   Ivp dye [iodinated diagnostic agents]   Review of Systems Review of Systems  Constitutional: Negative for activity change.  Respiratory: Negative for shortness of breath.   Cardiovascular: Negative for chest pain.  Gastrointestinal: Negative for abdominal pain.  Neurological: Positive for headaches.  All other systems reviewed and are negative.    Physical Exam Updated Vital Signs BP 123/86 (BP Location: Left Arm)   Pulse 65   Temp 99.1 F (37.3 C) (Oral)   Resp 18   Ht 5\' 6"  (1.676 m)   Wt 77.1 kg (170 lb)   SpO2 100%   BMI 27.44 kg/m   Physical Exam  Constitutional: He is oriented to person, place, and time. He appears well-nourished.  HENT:  Head: Normocephalic.  Eyes: Conjunctivae are normal.  Cardiovascular: Normal rate.   Pulmonary/Chest: Effort normal and breath sounds normal. No respiratory distress.  Neurological: He is alert and oriented to person, place, and time. No cranial nerve deficit.  2 small abrasions to the back of his head.  Skin: Skin is warm and dry. He is not diaphoretic.  Psychiatric: He has a normal mood and affect. His behavior is normal.     ED Treatments / Results  Labs (all labs ordered are listed, but only abnormal results are displayed) Labs Reviewed - No data to display  EKG  EKG Interpretation None       Radiology Ct Head Wo Contrast  Result Date: 08/11/2017 CLINICAL DATA:  66 year old male with head trauma. No  loss of consciousness. EXAM: CT HEAD WITHOUT CONTRAST TECHNIQUE: Contiguous axial images were obtained from the base of the skull through the vertex without intravenous contrast. COMPARISON:  None. FINDINGS: Brain: The ventricles and sulci appropriate size for patient's age. Mild periventricular and deep white matter chronic microvascular ischemic changes noted. There is no acute intracranial hemorrhage. No mass effect or midline shift noted. No extra-axial fluid collection. Vascular: No hyperdense vessel or unexpected calcification. Skull: Normal. Negative for fracture or focal lesion. Sinuses/Orbits: No acute finding. Other: None IMPRESSION: No acute intracranial hemorrhage. Electronically Signed   By: Ceasar Mons.D.  On: 08/11/2017 19:59    Procedures Procedures (including critical care time)  Medications Ordered in ED Medications  bacitracin 500 UNIT/GM ointment (not administered)  Tdap (BOOSTRIX) injection 0.5 mL (0.5 mLs Intramuscular Given 08/11/17 1956)     Initial Impression / Assessment and Plan / ED Course  I have reviewed the triage vital signs and the nursing notes.  Pertinent labs & imaging results that were available during my care of the patient were reviewed by me and considered in my medical decision making (see chart for details).     Is a very pleasant 66 year old male who had a mechanical fall today.  He is running away from a dog and struck his head on the concrete.  Patient had no loss of consciousness.  Did not get attacked by the dog.  Patient has abrasion to the back of his head.  Mild headache.  8:42 PM Offered to place staple  but patient prefer not to, I think this will heal the same regardless.  We will have him use bacitracin.  Concussion precautions given.   Final Clinical Impressions(s) / ED Diagnoses   Final diagnoses:  None    New Prescriptions New Prescriptions   No medications on file     Abelino DerrickMackuen, Alaura Schippers Lyn, MD 08/11/17 2043

## 2017-08-20 ENCOUNTER — Encounter: Payer: Self-pay | Admitting: *Deleted

## 2017-09-03 ENCOUNTER — Encounter: Payer: Self-pay | Admitting: Cardiology

## 2017-09-03 ENCOUNTER — Ambulatory Visit: Payer: Federal, State, Local not specified - PPO | Admitting: Cardiology

## 2017-09-03 VITALS — BP 128/88 | HR 58 | Ht 66.5 in | Wt 180.5 lb

## 2017-09-03 DIAGNOSIS — I10 Essential (primary) hypertension: Secondary | ICD-10-CM

## 2017-09-03 DIAGNOSIS — I2510 Atherosclerotic heart disease of native coronary artery without angina pectoris: Secondary | ICD-10-CM

## 2017-09-03 DIAGNOSIS — R001 Bradycardia, unspecified: Secondary | ICD-10-CM

## 2017-09-03 DIAGNOSIS — E785 Hyperlipidemia, unspecified: Secondary | ICD-10-CM

## 2017-09-03 NOTE — Patient Instructions (Addendum)
Medication Instructions:  Your physician recommends that you continue on your current medications as directed. Please refer to the Current Medication list given to you today.  Labwork: NONE  Testing/Procedures: NONE  Follow-Up: Your physician wants you to follow-up in: 12 months with Dr. Turner. You will receive a reminder letter in the mail two months in advance. If you don't receive a letter, please call our office to schedule the follow-up appointment.   If you need a refill on your cardiac medications before your next appointment, please call your pharmacy.    

## 2017-09-03 NOTE — Progress Notes (Signed)
Cardiology Office Note:    Date:  09/03/2017   ID:  Scott Schwartz, DOB May 24, 1951, MRN [id number]  PCP:  Leanna Battles, MD  Cardiologist:  Fransico Him, MD   Referring MD: Leanna Battles, MD   Chief Complaint  Patient presents with  . Coronary Artery Disease  . Hypertension  . Hyperlipidemia    History of Present Illness:    Scott Schwartz is a 66 y.o. male with a hx of ASCAD and remote anterior STEMI in 2001 s/p cutting balloon angioplasty of the mid LAD , OSA on CPAP, GERD, HTN and dyslipidemia.  She is here today for followup and is doing well.  She denies any chest pain or pressure, SOB, DOE, PND, orthopnea, LE edema, dizziness, palpitations or syncope. She is compliant with her meds and is tolerating meds with no SE.      Past Medical History:  Diagnosis Date  . Asthma   . Bradycardia 07/19/2015  . CAD (coronary artery disease) 2001   anterior STEMI 2001 with cutting balloon angioplasty of the mid LAD   . Claustrophobia 09/22/2013  . Diabetes mellitus without complication ([place])   . ED (erectile dysfunction)   . GERD (gastroesophageal reflux disease)   . Glaucoma    Borderline  . History of colonoscopy 02/17/2003  . Hyperlipidemia   . Hypertension   . Nephrolithiasis   . OSA (obstructive sleep apnea) 09/22/2013    Past Surgical History:  Procedure Laterality Date  . CARDIAC CATHETERIZATION    . COLONOSCOPY  02/2003  . VASECTOMY      Current Medications: Current Meds  Medication Sig  . Albuterol (PROVENTIL IN) Inhale 1-2 puffs into the lungs 3 (three) times daily.  Marland Kitchen ALPRAZolam (XANAX) 0.25 MG tablet Take 0.25 mg by mouth at bedtime as needed for anxiety.  Marland Kitchen amLODipine (NORVASC) 5 MG tablet TAKE ONE TABLET BY MOUTH ONCE DAILY  . aspirin 81 MG chewable tablet Chew 81 mg by mouth daily.  . fluticasone (FLONASE) 50 MCG/ACT nasal spray Place into both nostrils daily. Use as directed  . folic acid (FOLVITE) 1 MG tablet Take 1 mg by mouth daily.  Marland Kitchen GARCINIA  CAMBOGIA-CHROMIUM PO Take 1 capsule by mouth daily.  . hydrochlorothiazide (HYDRODIURIL) 12.5 MG tablet Take 12.5 mg by mouth daily as needed (For swelling).   Marland Kitchen HYDROcodone-acetaminophen (NORCO/VICODIN) 5-325 MG tablet Take 5-325 tablets by mouth as directed. Use as directed. For pain  . Krill Oil 350 MG CAPS Take 1 capsule by mouth daily.  Marland Kitchen losartan (COZAAR) 100 MG tablet TAKE ONE TABLET BY MOUTH ONCE DAILY  . metoprolol succinate (TOPROL-XL) 25 MG 24 hr tablet Take 0.5 tablets (12.5 mg total) by mouth daily. (Patient taking differently: Take 25 mg by mouth daily. )  . Misc Natural Products (TART CHERRY ADVANCED PO) Take 1 capsule by mouth daily.  . montelukast (SINGULAIR) 10 MG tablet Take 10 mg by mouth at bedtime.  . Multiple Vitamin (MULTIVITAMIN) tablet Take 1 tablet by mouth daily.  . nitroGLYCERIN (NITROLINGUAL) 0.4 MG/SPRAY spray Place 1 spray under the tongue every 5 (five) minutes x 3 doses as needed for chest pain.  Marland Kitchen omeprazole (PRILOSEC) 20 MG capsule Take 20 mg by mouth daily.  . propranolol (INDERAL) 10 MG tablet Take 10 mg by mouth 2 (two) times daily as needed. Use as directed  . sildenafil (VIAGRA) 100 MG tablet Take 100 mg by mouth daily as needed for erectile dysfunction.     Allergies:   Ivp dye [iodinated diagnostic  agents]   Social History   Socioeconomic History  . Marital status: Single    Spouse name: None  . Number of children: 3  . Years of education: None  . Highest education level: None  Social Needs  . Financial resource strain: None  . Food insecurity - worry: None  . Food insecurity - inability: None  . Transportation needs - medical: None  . Transportation needs - non-medical: None  Occupational History  . Occupation: RET    Comment: POSTAL  Tobacco Use  . Smoking status: Former Smoker    Years: 7.00    Types: Cigarettes  . Smokeless tobacco: Never Used  . Tobacco comment: Pt Quit 1970's  Substance and Sexual Activity  . Alcohol use: No    . Drug use: No  . Sexual activity: None  Other Topics Concern  . None  Social History Narrative   Pt married 3 children    1 grandchild   Ret [employer]   Former smoker 1 pack x's 62 yrs    Quit in 22's     Family History: The patient's family history includes CAD in his brother; CAD (age of onset: 51) in his father; Leukemia (age of onset: 41) in his mother.  ROS:   Please see the history of present illness.    ROS  All other systems reviewed and negative.   EKGs/Labs/Other Studies Reviewed:    The following studies were reviewed today: none  EKG:  EKG is  ordered today.  The ekg ordered today demonstrates Sinus bradycardia at 54bpm with no ST/T wave abnormality  Recent Labs: No results found for requested labs within last 8760 hours.   Recent Lipid Panel    Component Value Date/Time   CHOL 137 07/16/2016 1119   TRIG 111 07/16/2016 1119   HDL 29 (L) 07/16/2016 1119   CHOLHDL 4.7 07/16/2016 1119   VLDL 22 07/16/2016 1119   LDLCALC 86 07/16/2016 1119    Physical Exam:    VS:  BP 128/88   Pulse (!) 58   Ht 5' 6.5" (1.689 m)   Wt 180 lb 8 oz (81.9 kg)   SpO2 98%   BMI 28.70 kg/m     Wt Readings from Last 3 Encounters:  09/03/17 180 lb 8 oz (81.9 kg)  08/11/17 170 lb (77.1 kg)  06/15/17 175 lb (79.4 kg)     GEN:  Well nourished, well developed in no acute distress HEENT: Normal NECK: No JVD; No carotid bruits LYMPHATICS: No lymphadenopathy CARDIAC: RRR, no murmurs, rubs, gallops RESPIRATORY:  Clear to auscultation without rales, wheezing or rhonchi  ABDOMEN: Soft, non-tender, non-distended MUSCULOSKELETAL:  No edema; No deformity  SKIN: Warm and dry NEUROLOGIC:  Alert and oriented x 3 PSYCHIATRIC:  Normal affect   ASSESSMENT:    1. Coronary artery disease involving native coronary artery of native heart without angina pectoris   2. Benign essential HTN   3. Dyslipidemia   4. Bradycardia    PLAN:    In order of problems listed above:  1.   ASCAD - s/p remote anterior wall STEMI with cutting balloon PCI of the mid LAD - he has not had any anginal CP. He will continue on ASA 74m daily.  2.  HTN - BP is well controlled on exam today.  He will continue on amlodipine 546mdaily, Losartan 10048maily.  3.  Hyperlipidiemia - LDL goal < 70.  He will continue on Zetia 72m73mily.  Patient has refused  statin therapy. I have asked him to have his PCP send a copy of his lipids.    4.  Bradycardia - asymptomatic   Medication Adjustments/Labs and Tests Ordered: Current medicines are reviewed at length with the patient today.  Concerns regarding medicines are outlined above.  Orders Placed This Encounter  Procedures  . EKG 12-Lead   No orders of the defined types were placed in this encounter.   Signed, Fransico Him, MD  09/03/2017 9:17 AM    [place]

## 2017-09-15 ENCOUNTER — Other Ambulatory Visit: Payer: Self-pay | Admitting: *Deleted

## 2017-09-15 MED ORDER — LOSARTAN POTASSIUM 100 MG PO TABS: 100.0000 mg | ORAL_TABLET | Freq: Every day | ORAL | 11 refills | Status: DC

## 2017-09-15 MED ORDER — AMLODIPINE BESYLATE 5 MG PO TABS: 5.0000 mg | ORAL_TABLET | Freq: Every day | ORAL | 11 refills | Status: DC

## 2017-10-25 ENCOUNTER — Telehealth: Payer: Self-pay | Admitting: Pharmacist

## 2017-10-25 NOTE — Telephone Encounter (Signed)
Pt on zetia 10mg . TC 192, TG 187, HDL 33, LDL 122 and not at goal <70. Per our records pt has tried pravastatin 40mg  daily. Per Dr. Norris Crossurner's note pt refuses statin medications.   LMOM to call back to discuss options.

## 2017-10-25 NOTE — Telephone Encounter (Signed)
-----   Message from CanonesDonnisha Robertson, [place]RN sent at 10/25/2017 11:07 AM EST ----- Forwarded to lipid clinic for further recommendations per Dr. Mayford Knifeurner

## 2017-10-27 NOTE — Telephone Encounter (Signed)
Pt scheduled in lipid clinic 1/17.

## 2017-11-04 ENCOUNTER — Encounter (INDEPENDENT_AMBULATORY_CARE_PROVIDER_SITE_OTHER): Payer: Self-pay

## 2017-11-04 ENCOUNTER — Ambulatory Visit (INDEPENDENT_AMBULATORY_CARE_PROVIDER_SITE_OTHER): Payer: Federal, State, Local not specified - PPO | Admitting: Pharmacist

## 2017-11-04 ENCOUNTER — Encounter: Payer: Self-pay | Admitting: Pharmacist

## 2017-11-04 DIAGNOSIS — E785 Hyperlipidemia, unspecified: Secondary | ICD-10-CM

## 2017-11-04 DIAGNOSIS — I2583 Coronary atherosclerosis due to lipid rich plaque: Secondary | ICD-10-CM

## 2017-11-04 DIAGNOSIS — I2510 Atherosclerotic heart disease of native coronary artery without angina pectoris: Secondary | ICD-10-CM | POA: Diagnosis not present

## 2017-11-04 MED ORDER — EZETIMIBE 10 MG PO TABS: 10.0000 mg | ORAL_TABLET | Freq: Every day | ORAL | 3 refills | Status: DC

## 2017-11-04 NOTE — Patient Instructions (Addendum)
START Zetia 10mg  daily  We will repeat lipid panel in 3 months    Call [phone] Nicholaus Bloom- Kelley, Pharmacist   Cholesterol Cholesterol is a fat. Your body needs a small amount of cholesterol. Cholesterol (plaque) may build up in your blood vessels (arteries). That makes you more likely to have a heart attack or stroke. You cannot feel your cholesterol level. Having a blood test is the only way to find out if your level is high. Keep your test results. Work with your doctor to keep your cholesterol at a good level. What do the results mean?  Total cholesterol is how much cholesterol is in your blood.  LDL is bad cholesterol. This is the type that can build up. Try to have low LDL.  HDL is good cholesterol. It cleans your blood vessels and carries LDL away. Try to have high HDL.  Triglycerides are fat that the body can store or burn for energy. What are good levels of cholesterol?  Total cholesterol below 200.  LDL below 100 is good for people who have health risks. LDL below 70 is good for people who have very high risks.  HDL above 40 is good. It is best to have HDL of 60 or higher.  Triglycerides below 150. How can I lower my cholesterol? Diet Follow your diet program as told by your doctor.  Choose fish, white meat chicken, or [place] that is roasted or baked. Try not to eat red meat, fried foods, sausage, or lunch meats.  Eat lots of fresh fruits and vegetables.  Choose whole grains, beans, pasta, potatoes, and cereals.  Choose olive oil, corn oil, or canola oil. Only use small amounts.  Try not to eat butter, mayonnaise, shortening, or palm kernel oils.  Try not to eat foods with trans fats.  Choose low-fat or nonfat dairy foods. ? Drink skim or nonfat milk. ? Eat low-fat or nonfat yogurt and cheeses. ? Try not to drink whole milk or cream. ? Try not to eat ice cream, egg yolks, or full-fat cheeses.  Healthy desserts include angel food cake, ginger snaps, animal  crackers, hard candy, popsicles, and low-fat or nonfat frozen yogurt. Try not to eat pastries, cakes, pies, and cookies.  Exercise Follow your exercise program as told by your doctor.  Be more active. Try gardening, walking, and taking the stairs.  Ask your doctor about ways that you can be more active.  Medicine  Take over-the-counter and prescription medicines only as told by your doctor. This information is not intended to replace advice given to you by your health care provider. Make sure you discuss any questions you have with your health care provider. Document Released: 01/01/2009 Document Revised: 05/06/2016 Document Reviewed: 04/16/2016 Elsevier Interactive Patient Education  [employer]2018 Else[employer] Inc[employer]

## 2017-11-04 NOTE — Progress Notes (Signed)
Patient ID: Scott Schwartz                 DOB: 05/19/1951                    MRN: [id number]     HPI: Scott SineReginald Schwartz is a 67 y.o. male patient of Dr. Mayford Knifeurner that presents today for lipid evaluation.  PMH includes hx of ASCAD and remote anterior STEMI in 2001 s/p cutting balloon angioplasty of the mid LAD,OSA on CPAP, GERD, HTN and dyslipidemia. He has previously refused statin therapy.   He presents today for discussion of lipids. He states that he picked up Zetia but has not taken it. He wishes not to try statin medication because his brother-in-law had liver problems and joint aches. He also had a friend that had similar symptoms. He also reports that at the time of his event he was under a considerable more about of stress. So he believes his risk is lower now that stress levels are more controlled.   Risk Factors: CAD s/p STEMI, HTN LDL Goal: <70  Current Medications: zetia 10mg  daily - has not taken, krill oil 350mg  daily  Intolerances: pravastatin 40mg  daily - never tried  Diet: B: boiled egg, toast,oatmeal in package or cheese toast  - drinks juice or water occasional coffee He eats at home as much as possible, but does eat out a lot. He likes to go to [employer] style restaurants. He loves chickfila he does try to get grilled. He does do well vegetables. He uses the steamable packs of vegetables. He has tried to cut back on ice cream. He also has cut his cookies back.   Exercise: No formal exercise. He would like to start but has had a cold recently with congestion in his chest. 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 He has a grandson who he is active with.   Family History: CAD in his brother - aortic aneurysm repair; CAD (age of onset: 8056) in his father; Leukemia (age of onset: 2677) in his mother.  Social History: former smoker quit in 1970s, denies alcohol - none since 10 years ago  Labs: 10/24/17: TC 192, TG 187, HDL 33, LDL 122 - krill oil  Past Medical History:    Diagnosis Date  . Asthma   . Bradycardia 07/19/2015  . CAD (coronary artery disease) 2001   anterior STEMI 2001 with cutting balloon angioplasty of the mid LAD   . Claustrophobia 09/22/2013  . Diabetes mellitus without complication ([facility])   . ED (erectile dysfunction)   . GERD (gastroesophageal reflux disease)   . Glaucoma    Borderline  . History of colonoscopy 02/17/2003  . Hyperlipidemia   . Hypertension   . Nephrolithiasis   . OSA (obstructive sleep apnea) 09/22/2013    Current Outpatient Medications on File Prior to Visit  Medication Sig Dispense Refill  . Albuterol (PROVENTIL IN) Inhale 1-2 puffs into the lungs 3 (three) times daily.    Marland Kitchen. ALPRAZolam (XANAX) 0.25 MG tablet Take 0.25 mg by mouth at bedtime as needed for anxiety.    Marland Kitchen. amLODipine (NORVASC) 5 MG tablet Take 1 tablet (5 mg total) by mouth daily. 30 tablet 11  . aspirin 81 MG chewable tablet Chew 81 mg by mouth daily.    Marland Kitchen. ezetimibe (ZETIA) 10 MG tablet Take 1 tablet (10 mg total) by mouth daily. 30 tablet 11  . fluticasone (FLONASE) 50 MCG/ACT nasal spray Place into both nostrils  daily. Use as directed    . folic acid (FOLVITE) 1 MG tablet Take 1 mg by mouth daily.    Marland Kitchen GARCINIA CAMBOGIA-CHROMIUM PO Take 1 capsule by mouth daily.    . hydrochlorothiazide (HYDRODIURIL) 12.5 MG tablet Take 12.5 mg by mouth daily as needed (For swelling).     Marland Kitchen HYDROcodone-acetaminophen (NORCO/VICODIN) 5-325 MG tablet Take 5-325 tablets by mouth as directed. Use as directed. For pain  0  . Krill Oil 350 MG CAPS Take 1 capsule by mouth daily.    Marland Kitchen losartan (COZAAR) 100 MG tablet Take 1 tablet (100 mg total) by mouth daily. 30 tablet 11  . metoprolol succinate (TOPROL-XL) 25 MG 24 hr tablet Take 0.5 tablets (12.5 mg total) by mouth daily. (Patient taking differently: Take 25 mg by mouth daily. ) 15 tablet 11  . Misc Natural Products (TART CHERRY ADVANCED PO) Take 1 capsule by mouth daily.    . montelukast (SINGULAIR) 10 MG tablet Take 10 mg  by mouth at bedtime.    . Multiple Vitamin (MULTIVITAMIN) tablet Take 1 tablet by mouth daily.    . nitroGLYCERIN (NITROLINGUAL) 0.4 MG/SPRAY spray Place 1 spray under the tongue every 5 (five) minutes x 3 doses as needed for chest pain.    Marland Kitchen omeprazole (PRILOSEC) 20 MG capsule Take 20 mg by mouth daily.    . propranolol (INDERAL) 10 MG tablet Take 10 mg by mouth 2 (two) times daily as needed. Use as directed    . sildenafil (VIAGRA) 100 MG tablet Take 100 mg by mouth daily as needed for erectile dysfunction.     No current facility-administered medications on file prior to visit.     Allergies  Allergen Reactions  . Ivp Dye [Iodinated Diagnostic Agents] Shortness Of Breath    Assessment/Plan: Hyperlipidemia: LDL not at goal. He is very resistant to trying a statin medication or any medication. He would prefer to try to modify his cholesterol with diet and exercise. After lengthy discussion about risks and benefits of medications, he is willing to try Zetia 10mg  daily. Will plan to repeat a lipid panel in 3 months. If still elevated at that time with lifestyle modifications and zetia he may be willing to revisit statin medications.    Thank you,  Freddie Apley. Cleatis Polka, PharmD  [facility]  11/04/2017 7:49 AM

## 2018-01-13 ENCOUNTER — Telehealth: Payer: Self-pay | Admitting: Cardiology

## 2018-01-13 NOTE — Telephone Encounter (Signed)
I advised patient to follow up with pharmacy regarding recall medication. Informed patient to confirm with pharmacy and verify if pharmacy has a new supply and call back if you need to make adjustments. Patient verbalized understanding and thankful for the call.

## 2018-01-13 NOTE — Telephone Encounter (Signed)
New message:   Pt c/o medication issue:  1. Name of Medication: losartan (COZAAR) 100 MG tablet  2. How are you currently taking this medication (dosage and times per day)? Take 1 tablet (100 mg total) by mouth daily.  3. Are you having a reaction (difficulty breathing--STAT)? No  4. What is your medication issue? Pt states he received a letter pertaining to this medication being recalled

## 2018-02-01 ENCOUNTER — Other Ambulatory Visit: Payer: Federal, State, Local not specified - PPO

## 2018-02-08 ENCOUNTER — Telehealth: Payer: Self-pay | Admitting: Pharmacist

## 2018-02-08 NOTE — Telephone Encounter (Signed)
LMOM to reschedule missed lab appt for lipids and hepatic panel.

## 2018-04-11 NOTE — Progress Notes (Signed)
[facility] - Clinic Note  04/12/2018     CHIEF COMPLAINT Patient presents for Retina Evaluation   HISTORY OF PRESENT ILLNESS: Scott Schwartz is a 67 y.o. male who presents to the clinic today for:   HPI    Retina Evaluation    In both eyes.  This started 2 months ago.  Associated Symptoms Pain.  Negative for Flashes, Trauma, Fever, Weight Loss, Scalp Tenderness, Redness, Floaters, Distortion, Photophobia, Jaw Claudication, Fatigue, Shoulder/Hip pain, Glare and Blind Spot.  Context:  distance vision, mid-range vision and near vision.  Treatments tried include artificial tears.  I, the attending physician,  performed the HPI with the patient and updated documentation appropriately.          Comments    Referral of Dr. Lavone Neri for retina eval.Patient states he has occasional pain OD, he has had floaters for years OU. Denies flashes and light sensitivity. Pt takes krill oil qd,multivitamins Qd. He uses artificial tears PRN.        Last edited by Rennis Chris, MD on 04/12/2018  9:35 AM. (History)    Pt states he sees Dr. Jimmey Ralph for routine care; Pt states he was dx 2 years ago by Dr. Jimmey Ralph with floaters OU; Pt states he no longer is able to see the floaters as much as he should; Pt endorses dx of HTN;   Referring physician: Mat Carne, DO [address] [place], [place] [address]  HISTORICAL INFORMATION:   Selected notes from the MEDICAL RECORD NUMBER Referred by Dr. Lavone Neri for concern of horseshoe tear OD LEE- 06.14.19 (S. Parker) [BCVA OD: 20/20-2 OS: 20/20 MRx OD: -5.75+1.50x003 OS: -5.50+0.50x158] Ocular Hx- cataract OU, glaucoma suspect OU, ERM OD, PVD OD PMH- HTN, kidney disease    CURRENT MEDICATIONS: Current Outpatient Medications (Ophthalmic Drugs)  Medication Sig  . prednisoLONE acetate (PRED FORTE) 1 % ophthalmic suspension Place 1 drop into the right eye 4 (four) times daily for 7 days.   No current facility-administered medications for  this visit.  (Ophthalmic Drugs)   Current Outpatient Medications (Other)  Medication Sig  . Albuterol (PROVENTIL IN) Inhale 1-2 puffs into the lungs 3 (three) times daily.  Marland Kitchen ALPRAZolam (XANAX) 0.25 MG tablet Take 0.25 mg by mouth at bedtime as needed for anxiety.  Marland Kitchen amLODipine (NORVASC) 5 MG tablet Take 1 tablet (5 mg total) by mouth daily.  Marland Kitchen aspirin 81 MG chewable tablet Chew 81 mg by mouth daily.  . fluticasone (FLONASE) 50 MCG/ACT nasal spray Place into both nostrils daily. Use as directed  . folic acid (FOLVITE) 1 MG tablet Take 1 mg by mouth daily.  Marland Kitchen GARCINIA CAMBOGIA-CHROMIUM PO Take 1 capsule by mouth daily.  . hydrochlorothiazide (HYDRODIURIL) 12.5 MG tablet Take 12.5 mg by mouth daily as needed (For swelling).   Boris Lown Oil 350 MG CAPS Take 1 capsule by mouth daily.  Marland Kitchen losartan (COZAAR) 100 MG tablet Take 1 tablet (100 mg total) by mouth daily.  . metoprolol succinate (TOPROL-XL) 25 MG 24 hr tablet Take 0.5 tablets (12.5 mg total) by mouth daily. (Patient taking differently: Take 25 mg by mouth daily. )  . Misc Natural Products (TART CHERRY ADVANCED PO) Take 1 capsule by mouth daily.  . montelukast (SINGULAIR) 10 MG tablet Take 10 mg by mouth at bedtime.  . Multiple Vitamin (MULTIVITAMIN) tablet Take 1 tablet by mouth daily.  . nitroGLYCERIN (NITROLINGUAL) 0.4 MG/SPRAY spray Place 1 spray under the tongue every 5 (five) minutes  x 3 doses as needed for chest pain.  Marland Kitchen omeprazole (PRILOSEC) 20 MG capsule Take 20 mg by mouth daily.  . propranolol (INDERAL) 10 MG tablet Take 10 mg by mouth 2 (two) times daily as needed. Use as directed  . sildenafil (VIAGRA) 100 MG tablet Take 100 mg by mouth daily as needed for erectile dysfunction.  Marland Kitchen ezetimibe (ZETIA) 10 MG tablet Take 1 tablet (10 mg total) by mouth daily.  Marland Kitchen HYDROcodone-acetaminophen (NORCO/VICODIN) 5-325 MG tablet Take 5-325 tablets by mouth as directed. Use as directed. For pain   No current facility-administered medications  for this visit.  (Other)      REVIEW OF SYSTEMS: ROS    Positive for: Cardiovascular, Eyes, Respiratory   Negative for: Constitutional, Gastrointestinal, Neurological, Skin, Genitourinary, Musculoskeletal, HENT, Endocrine, Psychiatric, Allergic/Imm, Heme/Lymph   Last edited by Eldridge Scot, LPN on 1/61/0960  9:15 AM. (History)       ALLERGIES Allergies  Allergen Reactions  . Ivp Dye [Iodinated Diagnostic Agents] Shortness Of Breath    PAST MEDICAL HISTORY Past Medical History:  Diagnosis Date  . Asthma   . Bradycardia 07/19/2015  . CAD (coronary artery disease) 2001   anterior STEMI 2001 with cutting balloon angioplasty of the mid LAD   . Claustrophobia 09/22/2013  . Diabetes mellitus without complication (HCC)   . ED (erectile dysfunction)   . GERD (gastroesophageal reflux disease)   . Glaucoma    Borderline  . History of colonoscopy 02/17/2003  . Hyperlipidemia   . Hypertension   . Nephrolithiasis   . OSA (obstructive sleep apnea) 09/22/2013   Past Surgical History:  Procedure Laterality Date  . CARDIAC CATHETERIZATION    . COLONOSCOPY  02/2003  . VASECTOMY      FAMILY HISTORY Family History  Problem Relation Age of Onset  . Leukemia Mother 14  . CAD Father 70  . CAD Brother     SOCIAL HISTORY Social History   Tobacco Use  . Smoking status: Former Smoker    Years: 7.00    Types: Cigarettes  . Smokeless tobacco: Never Used  . Tobacco comment: Pt Quit 1970's  Substance Use Topics  . Alcohol use: No  . Drug use: No         OPHTHALMIC EXAM:  Base Eye Exam    Visual Acuity (Snellen - Linear)      Right Left   Dist cc 20/25 20/40   Dist ph cc NI 20/30   Correction:  Glasses       Tonometry (Tonopen, 9:18 AM)      Right Left   Pressure 22 21       Tonometry #2 (Tonopen, 9:18 AM)      Right Left   Pressure 22 23       Pupils      Dark Light Shape React APD   Right 3 2 Round Brisk None   Left 3 2 Round Brisk None       Visual  Fields      Left Right    Full Full       Extraocular Movement      Right Left    Full, Ortho Full, Ortho       Neuro/Psych    Oriented x3:  Yes   Mood/Affect:  Normal       Dilation    Both eyes:  1.0% Mydriacyl, 2.5% Phenylephrine @ 9:18 AM        Slit Lamp and Fundus Exam  Slit Lamp Exam      Right Left   Lids/Lashes Dermatochalasis - upper lid Dermatochalasis - upper lid   Conjunctiva/Sclera Mild Melanosis Mild Melanosis   Cornea Arcus Arcus   Anterior Chamber Deep and quiet Deep and quiet   Iris Round and moderately dilated Round and moderately dilated   Lens 2+ Nuclear sclerosis, 1+ Cortical cataract 2+ Nuclear sclerosis, 1+ Cortical cataract   Vitreous Vitreous syneresis, Posterior vitreous detachment Vitreous syneresis, Posterior vitreous detachment       Fundus Exam      Right Left   Disc Tilted disc, Compact, 360 Peripapillary atrophy greatest temporally Tilted disc, Temporal Peripapillary atrophy   C/D Ratio 0.6 0.4   Macula Good foveal reflex, mild Retinal pigment epithelial mottling, trace Epiretinal membrane, No heme or edema Good foveal reflex, mild Retinal pigment epithelial mottling, trace Epiretinal membrane, No heme or edema   Vessels Copper wiring, AV crossing changes Copper wiring, AV crossing changes   Periphery Attached, patch of pigmented Lattice degeneration with horseshoe tear at 0730 -- no SRF Attached, pigmented cobblestoning at 0600        Refraction    Wearing Rx      Sphere Cylinder Axis Add   Right -5.75 +1.50 170 +1.75   Left -5.25 +0.50 170 +1.75       Manifest Refraction      Sphere Cylinder Axis Dist VA   Right -4.75 +1.50 015 20/25   Left -5.00 +0.50 160 20/30          IMAGING AND PROCEDURES  Imaging and Procedures for @TODAY @  OCT, Retina - OU - Both Eyes       Right Eye Quality was good. Central Foveal Thickness: 267. Progression has no prior data. Findings include normal foveal contour, no IRF, no SRF.    Left Eye Quality was good. Central Foveal Thickness: 260. Progression has no prior data. Findings include normal foveal contour, no IRF, no SRF (Trace ERM).   Notes *Images captured and stored on drive  Diagnosis / Impression:  OD: NFP, No IRF/SRF OS: NFP, No IRF/SRF  Clinical management:  See below  Abbreviations: NFP - Normal foveal profile. CME - cystoid macular edema. PED - pigment epithelial detachment. IRF - intraretinal fluid. SRF - subretinal fluid. EZ - ellipsoid zone. ERM - epiretinal membrane. ORA - outer retinal atrophy. ORT - outer retinal tubulation. SRHM - subretinal hyper-reflective material         Repair Retinal Breaks, Laser - OD - Right Eye       LASER PROCEDURE NOTE  Procedure:  Barrier laser retinopexy using slit lamp laser, RIGHT eye   Diagnosis:   Retinal tear, RIGHT eye                     Flap tear at 730 o'clock anterior to equator   Surgeon: Rennis Chris, MD, PhD  Anesthesia: Topical  Informed consent obtained, operative eye marked, and time out performed prior to initiation of laser.   Laser settings:  Lumenis Smart532 laser, slit lamp Lens: Mainster PRP 165 Power: 320 mW Spot size: 200 microns Duration: 50 msec  # spots: 196  Placement of laser: Using a Mainster PRP 165 contact lens at the slit lamp, laser was placed in 3-4 confluent rows around posterior borders of flap tear at 730 oclock anterior to equator. Additional rows were placed anteriorly using laser indirect ophthalmoscope: 205 spots, power 250 mW, 100 ms duration.  Complications: None.  Patient tolerated  the procedure well and received written and verbal post-procedure care information/education.                  ASSESSMENT/PLAN:    ICD-10-CM   1. 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 Posterior vitreous detachment of both eyes H43.813   3. Retinal edema H35.81 OCT, Retina - OU - Both Eyes  4. Myopia  of both eyes H52.13   5. Combined forms of age-related cataract of both eyes H25.813     1. Horseshoe Retinal tear, OD   - The incidence, risk factors, and natural history of retinal tear was discussed with patient.   - Potential treatment options including laser retinopexy and cryotherapy discussed with patient. - pigmented patch of lattice with horseshoe tear at 0730 - recommend laser retinopexy OD today (06.25.19) - pt wishes to proceed - RBA of procedure discussed, questions answered - informed consent obtained and signed - see procedure note - start PF QID x7 days - f/u in 1-2 wks  2. PVD / vitreous syneresis OU  Discussed findings and prognosis  No RT or RD on 360 scleral depressed exam  Reviewed s/s of RT/RD  Strict return precautions for any such RT/RD signs/symptoms  3. No retinal edema on exam or OCT  4. Myopia OU-  - discussed increased risk of RT/RD with myopia - management per Dr. Jimmey RalphParker - monitor  5. Combined form age related cataract OU-  - The symptoms of cataract, surgical options, and treatments and risks were discussed with patient. - discussed diagnosis and progression - not yet visually significant - monitor for now   Ophthalmic Meds Ordered this visit:  Meds ordered this encounter  Medications  . prednisoLONE acetate (PRED FORTE) 1 % ophthalmic suspension    Sig: Place 1 drop into the right eye 4 (four) times daily for 7 days.    Dispense:  10 mL    Refill:  0       Return in about 2 weeks (around 04/26/2018) for POV - laser retinopexy OD.  There are no Patient Instructions on file for this visit.   Explained the diagnoses, plan, and follow up with the patient and they expressed understanding.  Patient expressed understanding of the importance of proper follow up care.   This document serves as a record of services personally performed by Karie ChimeraBrian G. Devontaye Ground, MD, PhD. It was created on their behalf by Virgilio BellingMeredith Fabian, COA, a certified ophthalmic  assistant. The creation of this record is the provider's dictation and/or activities during the visit.  Electronically signed by: Virgilio BellingMeredith Fabian, COA  06.24.19 12:50 PM   Karie ChimeraBrian G. Edilia Ghuman, M.D., Ph.D. Diseases & Surgery of the Retina and Vitreous [facility]  I have reviewed the above documentation for accuracy and completeness, and I agree with the above. Karie ChimeraBrian G. Demond Shallenberger, M.D., Ph.D. 04/13/18 12:52 PM    Abbreviations: M myopia (nearsighted); A astigmatism; H hyperopia (farsighted); P presbyopia; Mrx spectacle prescription;  CTL contact lenses; OD right eye; OS left eye; OU both eyes  XT exotropia; ET esotropia; PEK punctate epithelial keratitis; PEE punctate epithelial erosions; DES dry eye syndrome; MGD meibomian gland dysfunction; ATs artificial tears; PFAT's preservative free artificial tears; NSC nuclear sclerotic cataract; PSC posterior subcapsular cataract; ERM epi-retinal membrane; PVD posterior vitreous detachment; RD retinal detachment; DM diabetes mellitus; DR diabetic retinopathy; NPDR non-proliferative diabetic retinopathy; PDR proliferative diabetic retinopathy; CSME clinically significant macular  edema; DME diabetic macular edema; dbh dot blot hemorrhages; CWS cotton wool spot; POAG primary open angle glaucoma; C/D cup-to-disc ratio; HVF humphrey visual field; GVF goldmann visual field; OCT optical coherence tomography; IOP intraocular pressure; BRVO Branch retinal vein occlusion; CRVO central retinal vein occlusion; CRAO central retinal artery occlusion; BRAO branch retinal artery occlusion; RT retinal tear; SB scleral buckle; PPV pars plana vitrectomy; VH Vitreous hemorrhage; PRP panretinal laser photocoagulation; IVK intravitreal kenalog; VMT vitreomacular traction; MH Macular hole;  NVD neovascularization of the disc; NVE neovascularization elsewhere; AREDS age related eye disease study; ARMD age related macular degeneration; POAG primary open angle glaucoma;  EBMD epithelial/anterior basement membrane dystrophy; ACIOL anterior chamber intraocular lens; IOL intraocular lens; PCIOL posterior chamber intraocular lens; Phaco/IOL phacoemulsification with intraocular lens placement; PRK photorefractive keratectomy; LASIK laser assisted in situ keratomileusis; HTN hypertension; DM diabetes mellitus; COPD chronic obstructive pulmonary disease

## 2018-04-12 ENCOUNTER — Encounter (INDEPENDENT_AMBULATORY_CARE_PROVIDER_SITE_OTHER): Payer: Self-pay | Admitting: Ophthalmology

## 2018-04-12 ENCOUNTER — Ambulatory Visit (INDEPENDENT_AMBULATORY_CARE_PROVIDER_SITE_OTHER): Payer: Federal, State, Local not specified - PPO | Admitting: Ophthalmology

## 2018-04-12 DIAGNOSIS — H25813 Combined forms of age-related cataract, bilateral: Secondary | ICD-10-CM

## 2018-04-12 DIAGNOSIS — H3581 Retinal edema: Secondary | ICD-10-CM

## 2018-04-12 DIAGNOSIS — H33311 Horseshoe tear of retina without detachment, right eye: Secondary | ICD-10-CM

## 2018-04-12 DIAGNOSIS — H43813 Vitreous degeneration, bilateral: Secondary | ICD-10-CM | POA: Diagnosis not present

## 2018-04-12 DIAGNOSIS — H5213 Myopia, bilateral: Secondary | ICD-10-CM | POA: Diagnosis not present

## 2018-04-12 MED ORDER — PREDNISOLONE ACETATE 1 % OP SUSP: 1.0000 [drp] | Freq: Four times a day (QID) | OPHTHALMIC | 0 refills | Status: AC

## 2018-04-13 ENCOUNTER — Encounter (INDEPENDENT_AMBULATORY_CARE_PROVIDER_SITE_OTHER): Payer: Self-pay | Admitting: Ophthalmology

## 2018-04-18 NOTE — Telephone Encounter (Signed)
LMOM to follow up missed lab appt for cholesterol to reschedule.

## 2018-04-25 ENCOUNTER — Encounter (INDEPENDENT_AMBULATORY_CARE_PROVIDER_SITE_OTHER): Payer: Federal, State, Local not specified - PPO | Admitting: Ophthalmology

## 2018-04-25 NOTE — Progress Notes (Signed)
[facility] - Clinic Note  04/26/2018     CHIEF COMPLAINT Patient presents for Retina Follow Up   HISTORY OF PRESENT ILLNESS: Scott Schwartz is a 67 y.o. male who presents to the clinic today for:   HPI    Retina Follow Up    Patient presents with  Other.  In right eye.  This started 2 weeks ago.  Since onset it is rapidly improving.  I, the attending physician,  performed the HPI with the patient and updated documentation appropriately.          Comments    POV S/P laser retinopexy OD (04/12/18). Patient states his vision "much better", denies ocular pain.        Last edited by Rennis Chris, MD on 04/26/2018  9:12 AM. (History)    Pt states he sees Dr. Jimmey Ralph for routine care; Pt states he was dx 2 years ago by Dr. Jimmey Ralph with floaters OU; Pt states he no longer is able to see the floaters as much as he should; Pt endorses dx of HTN;   Referring physician: Jarome Matin, MD [address] [place], [place] [address]  HISTORICAL INFORMATION:   Selected notes from the MEDICAL RECORD NUMBER Referred by Dr. Lavone Neri for concern of horseshoe tear OD LEE- 06.14.19 (S. Parker) [BCVA OD: 20/20-2 OS: 20/20 MRx OD: -5.75+1.50x003 OS: -5.50+0.50x158] Ocular Hx- cataract OU, glaucoma suspect OU, ERM OD, PVD OD PMH- HTN, kidney disease    CURRENT MEDICATIONS: No current outpatient medications on file. (Ophthalmic Drugs)   No current facility-administered medications for this visit.  (Ophthalmic Drugs)   Current Outpatient Medications (Other)  Medication Sig  . Albuterol (PROVENTIL IN) Inhale 1-2 puffs into the lungs 3 (three) times daily.  Marland Kitchen ALPRAZolam (XANAX) 0.25 MG tablet Take 0.25 mg by mouth at bedtime as needed for anxiety.  Marland Kitchen amLODipine (NORVASC) 5 MG tablet Take 1 tablet (5 mg total) by mouth daily.  Marland Kitchen aspirin 81 MG chewable tablet Chew 81 mg by mouth daily.  . fluticasone (FLONASE) 50 MCG/ACT nasal spray Place into both nostrils daily. Use as directed   . folic acid (FOLVITE) 1 MG tablet Take 1 mg by mouth daily.  Marland Kitchen GARCINIA CAMBOGIA-CHROMIUM PO Take 1 capsule by mouth daily.  . hydrochlorothiazide (HYDRODIURIL) 12.5 MG tablet Take 12.5 mg by mouth daily as needed (For swelling).   Marland Kitchen HYDROcodone-acetaminophen (NORCO/VICODIN) 5-325 MG tablet Take 5-325 tablets by mouth as directed. Use as directed. For pain  . Krill Oil 350 MG CAPS Take 1 capsule by mouth daily.  Marland Kitchen losartan (COZAAR) 100 MG tablet Take 1 tablet (100 mg total) by mouth daily.  . metoprolol succinate (TOPROL-XL) 25 MG 24 hr tablet Take 0.5 tablets (12.5 mg total) by mouth daily. (Patient taking differently: Take 25 mg by mouth daily. )  . Misc Natural Products (TART CHERRY ADVANCED PO) Take 1 capsule by mouth daily.  . montelukast (SINGULAIR) 10 MG tablet Take 10 mg by mouth at bedtime.  . Multiple Vitamin (MULTIVITAMIN) tablet Take 1 tablet by mouth daily.  . nitroGLYCERIN (NITROLINGUAL) 0.4 MG/SPRAY spray Place 1 spray under the tongue every 5 (five) minutes x 3 doses as needed for chest pain.  Marland Kitchen omeprazole (PRILOSEC) 20 MG capsule Take 20 mg by mouth daily.  . propranolol (INDERAL) 10 MG tablet Take 10 mg by mouth 2 (two) times daily as needed. Use as directed  . sildenafil (VIAGRA) 100 MG tablet Take 100 mg by mouth daily as needed  for erectile dysfunction.  Marland Kitchen. ezetimibe (ZETIA) 10 MG tablet Take 1 tablet (10 mg total) by mouth daily.   No current facility-administered medications for this visit.  (Other)      REVIEW OF SYSTEMS: ROS    Positive for: Eyes   Negative for: Constitutional, Gastrointestinal, Neurological, Skin, Genitourinary, Musculoskeletal, HENT, Endocrine, Cardiovascular, Respiratory, Psychiatric, Allergic/Imm, Heme/Lymph   Last edited by Eldridge ScotKendrick, Glenda, LPN on 1/6/10967/06/2018  9:05 AM. (History)       ALLERGIES Allergies  Allergen Reactions  . Ivp Dye [Iodinated Diagnostic Agents] Shortness Of Breath    PAST MEDICAL HISTORY Past Medical History:   Diagnosis Date  . Asthma   . Bradycardia 07/19/2015  . CAD (coronary artery disease) 2001   anterior STEMI 2001 with cutting balloon angioplasty of the mid LAD   . Claustrophobia 09/22/2013  . Diabetes mellitus without complication (HCC)   . ED (erectile dysfunction)   . GERD (gastroesophageal reflux disease)   . Glaucoma    Borderline  . History of colonoscopy 02/17/2003  . Hyperlipidemia   . Hypertension   . Nephrolithiasis   . OSA (obstructive sleep apnea) 09/22/2013   Past Surgical History:  Procedure Laterality Date  . CARDIAC CATHETERIZATION    . COLONOSCOPY  02/2003  . VASECTOMY      FAMILY HISTORY Family History  Problem Relation Age of Onset  . Leukemia Mother [id number]  . CAD Father [id number]  . CAD Brother     SOCIAL HISTORY Social History   Tobacco Use  . Smoking status: Former Smoker    Years: 7.00    Types: Cigarettes  . Smokeless tobacco: Never Used  . Tobacco comment: Pt Quit 1970's  Substance Use Topics  . Alcohol use: No  . Drug use: No         OPHTHALMIC EXAM:  Base Eye Exam    Visual Acuity (Snellen - Linear)      Right Left   Dist cc 20/25 +2 20/40   Dist ph cc 20/25 +3 20/25   Correction:  Glasses       Tonometry (Tonopen, 9:07 AM)      Right Left   Pressure 18 18       Pupils      Dark Light Shape React APD   Right 3 2 Round Brisk None   Left 3 2 Round Brisk None       Visual Fields (Counting fingers)      Left Right    Full Full       Extraocular Movement      Right Left    Full, Ortho Full, Ortho       Neuro/Psych    Oriented x3:  Yes   Mood/Affect:  Normal       Dilation    Both eyes:  phenylephrine @ 9:07 AM        Slit Lamp and Fundus Exam    Slit Lamp Exam      Right Left   Lids/Lashes Dermatochalasis - upper lid Dermatochalasis - upper lid   Conjunctiva/Sclera Mild Melanosis Mild Melanosis   Cornea Arcus Arcus   Anterior Chamber Deep and quiet Deep and quiet   Iris Round and moderately dilated Round and  moderately dilated   Lens 2+ Nuclear sclerosis, 1+ Cortical cataract 2+ Nuclear sclerosis, 1+ Cortical cataract   Vitreous Vitreous syneresis, Posterior vitreous detachment Vitreous syneresis, Posterior vitreous detachment       Fundus Exam      Right Left  Disc Tilted disc, Compact, 360 Peripapillary atrophy greatest temporally Tilted disc, Temporal Peripapillary atrophy   C/D Ratio 0.4 0.4   Macula Good foveal reflex, mild Retinal pigment epithelial mottling, trace Epiretinal membrane, No heme or edema Good foveal reflex, mild Retinal pigment epithelial mottling, trace Epiretinal membrane, No heme or edema   Vessels Copper wiring, AV crossing changes, mild Vascular attenuation Copper wiring, AV crossing changes   Periphery Attached, patch of pigmented Lattice degeneration with horseshoe tear at 0730 -- no SRF -- good laser surrounding Attached, pigmented cobblestoning at 0600          IMAGING AND PROCEDURES  Imaging and Procedures for @TODAY @           ASSESSMENT/PLAN:    ICD-10-CM   1. 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 Posterior vitreous detachment of both eyes H43.813   3. Retinal edema H35.81   4. Myopia of both eyes H52.13   5. Combined forms of age-related cataract of both eyes H25.813     1. Horseshoe Retinal tear, OD   - pigmented patch of lattice with horseshoe tear at 0730 - S/P laser retinopexy OD (06.25.19) -- good laser surrounding - finished PF QID x7 days - f/u 3 months  2. PVD / vitreous syneresis OU  Discussed findings and prognosis  No RT or RD on 360 scleral depressed exam  Reviewed s/s of RT/RD  Strict return precautions for any such RT/RD signs/symptoms  3. No retinal edema on exam or OCT  4. Myopia OU-  - discussed increased risk of RT/RD with myopia - management per Dr. Jimmey Ralph - monitor  5. Combined form age related cataract OU-  - The symptoms of cataract, surgical options, and treatments and risks were discussed with  patient. - discussed diagnosis and progression - not yet visually significant - monitor for now   Ophthalmic Meds Ordered this visit:  No orders of the defined types were placed in this encounter.      Return in about 3 months (around 07/27/2018) for F/U laser retinopexy OD, Dilated Exam, OCT.  There are no Patient Instructions on file for this visit.   Explained the diagnoses, plan, and follow up with the patient and they expressed understanding.  Patient expressed understanding of the importance of proper follow up care.   This document serves as a record of services personally performed by Karie Chimera, MD, PhD. It was created on their behalf by Laurian Brim, OA, an ophthalmic assistant. The creation of this record is the provider's dictation and/or activities during the visit.    Electronically signed by: Laurian Brim, OA  07.08.2019 10:24 AM   This document serves as a record of services personally performed by Karie Chimera, MD, PhD. It was created on their behalf by Virgilio Belling, COA, a certified ophthalmic assistant. The creation of this record is the provider's dictation and/or activities during the visit.  Electronically signed by: Virgilio Belling, COA  07.09.19 10:24 AM   Karie Chimera, M.D., Ph.D. Diseases & Surgery of the Retina and Vitreous [facility]   I have reviewed the above documentation for accuracy and completeness, and I agree with the above. Karie Chimera, M.D., Ph.D. 04/26/18 10:25 AM    Abbreviations: M myopia (nearsighted); A astigmatism; H hyperopia (farsighted); P presbyopia; Mrx spectacle prescription;  CTL contact lenses; OD right eye; OS left eye; OU both eyes  XT exotropia; ET esotropia; PEK punctate epithelial keratitis; PEE  punctate epithelial erosions; DES dry eye syndrome; MGD meibomian gland dysfunction; ATs artificial tears; PFAT's preservative free artificial tears; Ariton nuclear sclerotic cataract; PSC  posterior subcapsular cataract; ERM epi-retinal membrane; PVD posterior vitreous detachment; RD retinal detachment; DM diabetes mellitus; DR diabetic retinopathy; NPDR non-proliferative diabetic retinopathy; PDR proliferative diabetic retinopathy; CSME clinically significant macular edema; DME diabetic macular edema; dbh dot blot hemorrhages; CWS cotton wool spot; POAG primary open angle glaucoma; C/D cup-to-disc ratio; HVF humphrey visual field; GVF goldmann visual field; OCT optical coherence tomography; IOP intraocular pressure; BRVO Branch retinal vein occlusion; CRVO central retinal vein occlusion; CRAO central retinal artery occlusion; BRAO branch retinal artery occlusion; RT retinal tear; SB scleral buckle; PPV pars plana vitrectomy; VH Vitreous hemorrhage; PRP panretinal laser photocoagulation; IVK intravitreal kenalog; VMT vitreomacular traction; MH Macular hole;  NVD neovascularization of the disc; NVE neovascularization elsewhere; AREDS age related eye disease study; ARMD age related macular degeneration; POAG primary open angle glaucoma; EBMD epithelial/anterior basement membrane dystrophy; ACIOL anterior chamber intraocular lens; IOL intraocular lens; PCIOL posterior chamber intraocular lens; Phaco/IOL phacoemulsification with intraocular lens placement; Cisco photorefractive keratectomy; LASIK laser assisted in situ keratomileusis; HTN hypertension; DM diabetes mellitus; COPD chronic obstructive pulmonary disease

## 2018-04-26 ENCOUNTER — Ambulatory Visit (INDEPENDENT_AMBULATORY_CARE_PROVIDER_SITE_OTHER): Payer: Federal, State, Local not specified - PPO | Admitting: Ophthalmology

## 2018-04-26 ENCOUNTER — Encounter (INDEPENDENT_AMBULATORY_CARE_PROVIDER_SITE_OTHER): Payer: Self-pay | Admitting: Ophthalmology

## 2018-04-26 DIAGNOSIS — H25813 Combined forms of age-related cataract, bilateral: Secondary | ICD-10-CM

## 2018-04-26 DIAGNOSIS — H43813 Vitreous degeneration, bilateral: Secondary | ICD-10-CM

## 2018-04-26 DIAGNOSIS — H5213 Myopia, bilateral: Secondary | ICD-10-CM

## 2018-04-26 DIAGNOSIS — H3581 Retinal edema: Secondary | ICD-10-CM

## 2018-04-26 DIAGNOSIS — H33311 Horseshoe tear of retina without detachment, right eye: Secondary | ICD-10-CM

## 2018-06-16 ENCOUNTER — Encounter: Payer: Self-pay | Admitting: Adult Health

## 2018-06-16 ENCOUNTER — Ambulatory Visit: Payer: Federal, State, Local not specified - PPO | Admitting: Adult Health

## 2018-06-16 VITALS — BP 136/77 | HR 48 | Ht 66.5 in | Wt 173.6 lb

## 2018-06-16 DIAGNOSIS — G4733 Obstructive sleep apnea (adult) (pediatric): Secondary | ICD-10-CM | POA: Diagnosis not present

## 2018-06-16 DIAGNOSIS — Z9989 Dependence on other enabling machines and devices: Secondary | ICD-10-CM

## 2018-06-16 NOTE — Progress Notes (Signed)
PATIENT: Scott Schwartz DOB: 09-11-1951  REASON FOR VISIT: follow up HISTORY FROM: patient  HISTORY OF PRESENT ILLNESS: Today 06/16/18 : Scott Schwartz is a 67 year old male with a history of obstructive sleep apnea on CPAP.  He returns today for follow-up.  His CPAP download indicates that he uses machine 27 out of 30 days for compliance of 90%.  He uses machine greater than 4 hours for 23 days for compliance of 73%.  On average he uses his machine 5 hours and 46 minutes.  His residual AHI is 1.7 on 6 cm of water with EPR 3.  His leak in the 95th percentile is 42.6 L/min.  He does state that he has changed his straps but he does need a new mask.  He also reports that at night he has very vivid violent dreams and he thrashes about in bed.  He reports that his wife has made mention of this several times.  He returns today for evaluation.  HISTORY Scott Schwartz underwent a sleep study on 03-24-2012 which confirmed the presence of sleep apnea at an AHI of 14 and an RDI of 26. During REM sleep he did not have accentuated higher number of apneas but in supine sleep his AHI was 33.8. He had minimum desaturations at the time and was titrated to 8 cm water. Best effective pressures seem to be 7 cm with a flex setting of 2 cm water for the PE are which means expiratory pressure relief. We met shortly after the sleep study and the patient switched from a facial mask to a nasal pillow which he has used since. He states that his machine already originated from 2003 when he was for the first time evaluated, diagnosed with OSA and titrated to CPAP. This machine no longer works for him and he is definitely overdue to receive a new one. The patient's past medical history is positive for diabetes mellitus with renal manifestations, type II.  Coronary artery disease with no angina symptoms, hypertension treatment with amlodipine, losartan, propranolol. Nitroglycerin lingual spray as needed. Hyperlipidemia was started on  pravastatin 40 mrem tablets, known sleep apnea history diagnosed for the first time 2003 we diagnosed in 2013 . Chief complaint according to patient : Sleep is fragmented, interrupted and no longer restorative. The patient reports that his wife has noted him to snore again loudly and to have irregular breathing.   REVIEW OF SYSTEMS: Out of a complete 14 system review of symptoms, the patient complains only of the following symptoms, and all other reviewed systems are negative.  See HPI  ALLERGIES: Allergies  Allergen Reactions  . Ivp Dye [Iodinated Diagnostic Agents] Shortness Of Breath    HOME MEDICATIONS: Outpatient Medications Prior to Visit  Medication Sig Dispense Refill  . Albuterol (PROVENTIL IN) Inhale 1-2 puffs into the lungs 3 (three) times daily.    Marland Kitchen ALPRAZolam (XANAX) 0.25 MG tablet Take 0.25 mg by mouth at bedtime as needed for anxiety.    Marland Kitchen amLODipine (NORVASC) 5 MG tablet Take 1 tablet (5 mg total) by mouth daily. 30 tablet 11  . aspirin 81 MG chewable tablet Chew 81 mg by mouth daily.    Marland Kitchen ezetimibe (ZETIA) 10 MG tablet Take 1 tablet (10 mg total) by mouth daily. 90 tablet 3  . fluticasone (FLONASE) 50 MCG/ACT nasal spray Place into both nostrils daily. Use as directed    . folic acid (FOLVITE) 1 MG tablet Take 1 mg by mouth daily.    Marland Kitchen GARCINIA  CAMBOGIA-CHROMIUM PO Take 1 capsule by mouth daily.    . hydrochlorothiazide (HYDRODIURIL) 12.5 MG tablet Take 12.5 mg by mouth daily as needed (For swelling).     Marland Kitchen HYDROcodone-acetaminophen (NORCO/VICODIN) 5-325 MG tablet Take 5-325 tablets by mouth as directed. Use as directed. For pain  0  . Krill Oil 350 MG CAPS Take 1 capsule by mouth daily.    Marland Kitchen losartan (COZAAR) 100 MG tablet Take 1 tablet (100 mg total) by mouth daily. 30 tablet 11  . metoprolol succinate (TOPROL-XL) 25 MG 24 hr tablet Take 0.5 tablets (12.5 mg total) by mouth daily. (Patient taking differently: Take 25 mg by mouth daily. ) 15 tablet 11  . Misc Natural  Products (TART CHERRY ADVANCED PO) Take 1 capsule by mouth daily.    . montelukast (SINGULAIR) 10 MG tablet Take 10 mg by mouth at bedtime.    . Multiple Vitamin (MULTIVITAMIN) tablet Take 1 tablet by mouth daily.    . nitroGLYCERIN (NITROLINGUAL) 0.4 MG/SPRAY spray Place 1 spray under the tongue every 5 (five) minutes x 3 doses as needed for chest pain.    Marland Kitchen omeprazole (PRILOSEC) 20 MG capsule Take 20 mg by mouth daily.    . propranolol (INDERAL) 10 MG tablet Take 10 mg by mouth 2 (two) times daily as needed. Use as directed    . sildenafil (VIAGRA) 100 MG tablet Take 100 mg by mouth daily as needed for erectile dysfunction.     No facility-administered medications prior to visit.     PAST MEDICAL HISTORY: Past Medical History:  Diagnosis Date  . Asthma   . Bradycardia 07/19/2015  . CAD (coronary artery disease) 2001   anterior STEMI 2001 with cutting balloon angioplasty of the mid LAD   . Claustrophobia 09/22/2013  . Diabetes mellitus without complication ([place])   . ED (erectile dysfunction)   . GERD (gastroesophageal reflux disease)   . Glaucoma    Borderline  . History of colonoscopy 02/17/2003  . Hyperlipidemia   . Hypertension   . Nephrolithiasis   . OSA (obstructive sleep apnea) 09/22/2013    PAST SURGICAL HISTORY: Past Surgical History:  Procedure Laterality Date  . CARDIAC CATHETERIZATION    . COLONOSCOPY  02/2003  . VASECTOMY      FAMILY HISTORY: Family History  Problem Relation Age of Onset  . Leukemia Mother 59  . CAD Father 24  . CAD Brother     SOCIAL HISTORY: Social History   Socioeconomic History  . Marital status: Single    Spouse name: Not on file  . Number of children: 3  . Years of education: Not on file  . Highest education level: Not on file  Occupational History  . Occupation: RET    Comment: POSTAL  Social Needs  . Financial resource strain: Not on file  . Food insecurity:    Worry: Not on file    Inability: Not on file  .  Transportation needs:    Medical: Not on file    Non-medical: Not on file  Tobacco Use  . Smoking status: Former Smoker    Years: 7.00    Types: Cigarettes  . Smokeless tobacco: Never Used  . Tobacco comment: Pt Quit 1970's  Substance and Sexual Activity  . Alcohol use: No  . Drug use: No  . Sexual activity: Not on file  Lifestyle  . Physical activity:    Days per week: Not on file    Minutes per session: Not on file  .  Stress: Not on file  Relationships  . Social connections:    Talks on phone: Not on file    Gets together: Not on file    Attends religious service: Not on file    Active member of club or organization: Not on file    Attends meetings of clubs or organizations: Not on file    Relationship status: Not on file  . Intimate partner violence:    Fear of current or ex partner: Not on file    Emotionally abused: Not on file    Physically abused: Not on file    Forced sexual activity: Not on file  Other Topics Concern  . Not on file  Social History Narrative   Pt married 3 children    1 grandchild   Ret [employer]   Former smoker 1 pack x's 36 yrs    Quit in 1970's      PHYSICAL EXAM  Vitals:   06/16/18 0857  BP: 136/77  Pulse: (!) 48  Weight: 173 lb 9.6 oz (78.7 kg)  Height: 5' 6.5" (1.689 m)   Body mass index is 27.6 kg/m.  Generalized: Well developed, in no acute distress   Neurological examination  Mentation: Alert oriented to time, place, history taking. Follows all commands speech and language fluent Cranial nerve II-XII: Extraocular movements were full, visual field were full on confrontational test. Facial sensation and strength were normal. Uvula tongue midline. Head turning and shoulder shrug  were normal and symmetric.  Mallampati 2+ Motor: The motor testing reveals 5 over 5 strength of all 4 extremities. Good symmetric motor tone is noted throughout.  Sensory: Sensory testing is intact to soft touch on all 4 extremities. No evidence of  extinction is noted.  Coordination: Cerebellar testing reveals good finger-nose-finger and heel-to-shin bilaterally.  Gait and station: Gait is normal.    DIAGNOSTIC DATA (LABS, IMAGING, TESTING) - I reviewed patient records, labs, notes, testing and imaging myself where available.  Lab Results  Component Value Date   WBC 12.1 (H) 12/16/2008   HGB 16.0 02/28/2011   HCT 47.0 02/28/2011   MCV 90.2 12/16/2008   PLT 201 12/16/2008      Component Value Date/Time   NA 142 07/01/2015 0754   K 3.6 07/01/2015 0754   CL 105 07/01/2015 0754   CO2 28 07/01/2015 0754   GLUCOSE 96 07/01/2015 0754   BUN 16 07/01/2015 0754   CREATININE 1.05 07/01/2015 0754   CALCIUM 9.2 07/01/2015 0754   PROT 7.0 07/16/2016 1119   ALBUMIN 4.3 07/16/2016 1119   AST 27 07/16/2016 1119   ALT 37 07/16/2016 1119   ALKPHOS 53 07/16/2016 1119   BILITOT 0.5 07/16/2016 1119   GFRNONAA 56 (L) 12/16/2008 0551   GFRAA  12/16/2008 0551    >60        The eGFR has been calculated using the MDRD equation. This calculation has not been validated in all clinical situations. eGFR's persistently <60 mL/min signify possible Chronic Kidney Disease.   Lab Results  Component Value Date   CHOL 137 07/16/2016   HDL 29 (L) 07/16/2016   LDLCALC 86 07/16/2016   TRIG 111 07/16/2016   CHOLHDL 4.7 07/16/2016      ASSESSMENT AND PLAN 67 y.o. year old male  has a past medical history of Asthma, Bradycardia (07/19/2015), CAD (coronary artery disease) (2001), Claustrophobia (09/22/2013), Diabetes mellitus without complication ([place]), ED (erectile dysfunction), GERD (gastroesophageal reflux disease), Glaucoma, History of colonoscopy (02/17/2003), Hyperlipidemia, Hypertension, Nephrolithiasis, and  OSA (obstructive sleep apnea) (09/22/2013). here with:  1.  Obstructive sleep apnea on CPAP 2. REM sleep behavior  The patient CPAP download shows excellent compliance and good treatment of his apnea.  He is encouraged to continue using  the CPAP nightly and greater than 4 hours each night.  I also encouraged the patient to change out his supplies regularly.  If this mask continues to leak he should let us know.  Patient is also reporting vivid dreams and thrashing about in the bed.  This could be related to REM sleep behavior disorder.  I recommended that he take melatonin 3 to 6 mg before bedtime.  If this is not beneficial he should let us know.  He will follow-up in 1 year or sooner if needed.  Ward Givens, MSN, NP-C 06/16/2018, 8:57 AM [facility] [address], [place], [place] [address] [phone]

## 2018-06-16 NOTE — Patient Instructions (Signed)
Your Plan:  Continue using CPAP nightly and >4 hours each night New supplies: mask Try Melatonin 3-6 mg before bedtime  If your symptoms worsen or you develop new symptoms please let us know.   Thank you for coming to see [facility] at [facility]. I hope we have been able to provide you high quality care today.  You may receive a patient satisfaction survey over the next few weeks. We would appreciate your feedback and comments so that we may continue to improve ourselves and the health of our patients.

## 2018-06-22 NOTE — Progress Notes (Signed)
I agree with the assessment and plan as directed by NP .The patient is known to me .   Juri Dinning, MD  

## 2018-07-06 IMAGING — CT CT HEAD W/O CM
3 of 4 series · 14 of 47 positions shown, 16 images · non-contrast
Comparison: None.

CLINICAL DATA: 66-year-old male with head trauma. No loss of
consciousness.

EXAM:
CT HEAD WITHOUT CONTRAST
TECHNIQUE: Contiguous axial images were obtained from the base of the skull
through the vertex without intravenous contrast.

[Series 2: head w/o · axial · non-contrast · 0.43mm/px · z∈[-157,-37]mm · 8 of 30 slices shown, 10 images]
[im 3/30  brain]
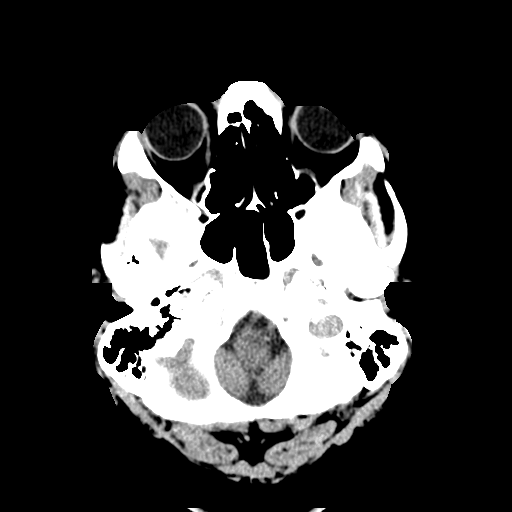
[im 3/30  bone]
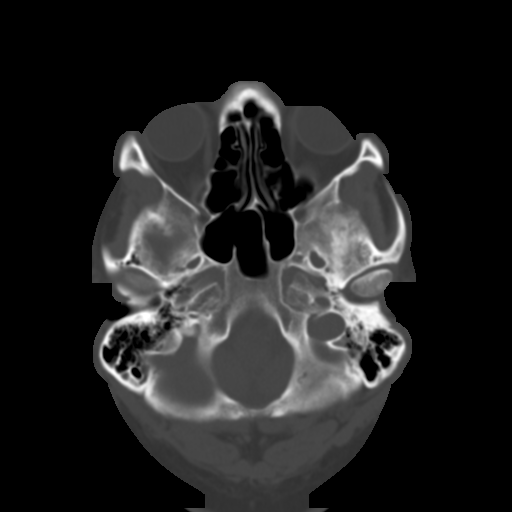
[im 6/30  brain]
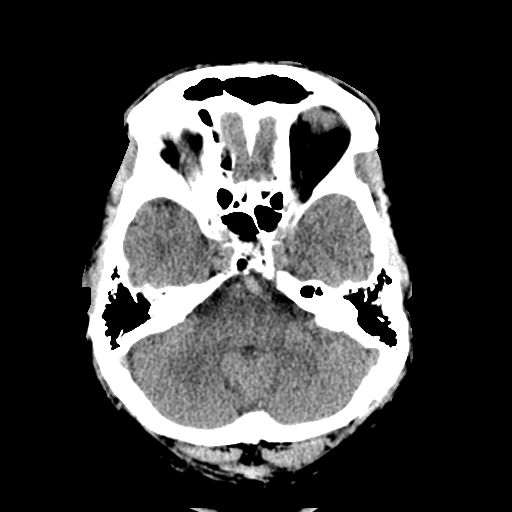
[im 9/30  brain]
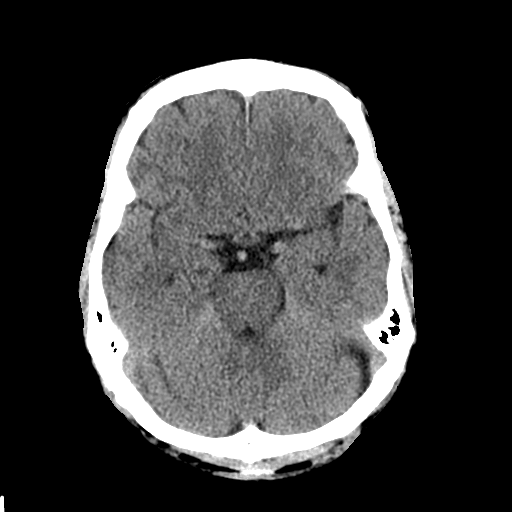
[im 12/30  brain]
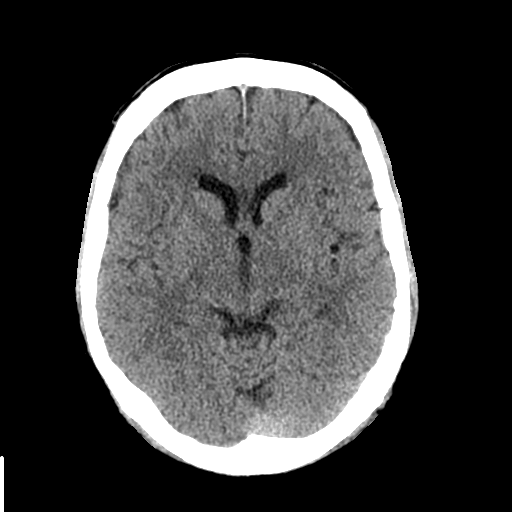
[im 18/30  brain]
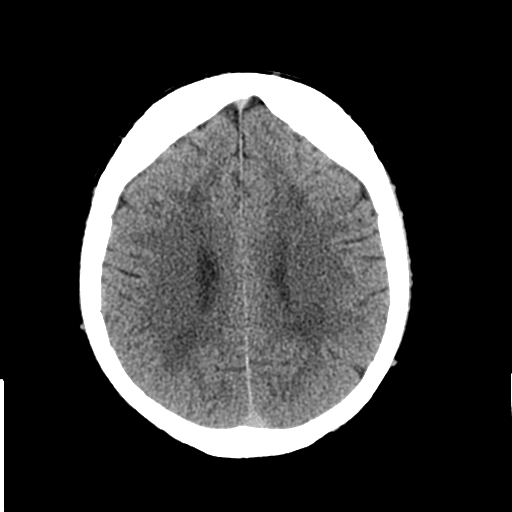
[im 18/30  bone]
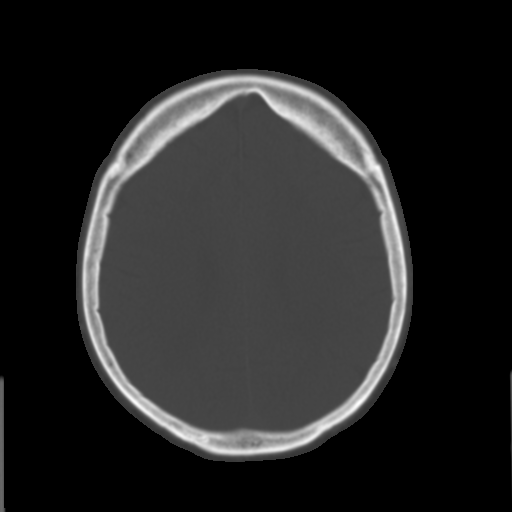
[im 21/30  brain]
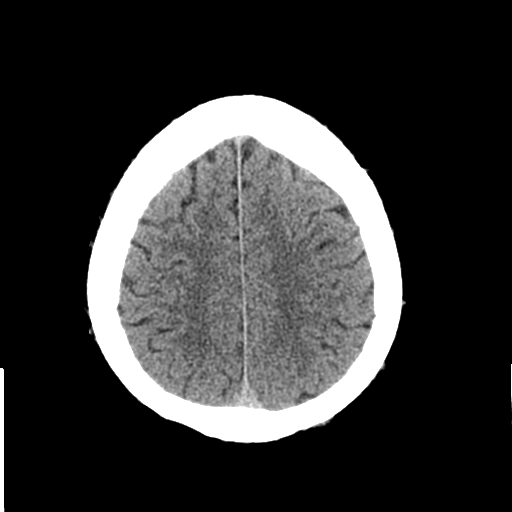
[im 24/30  brain]
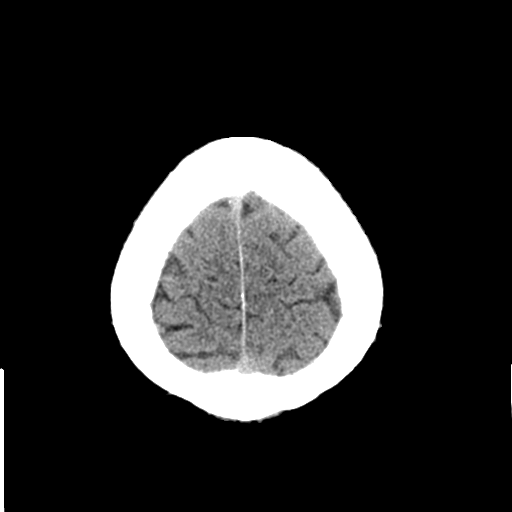
[im 27/30  brain]
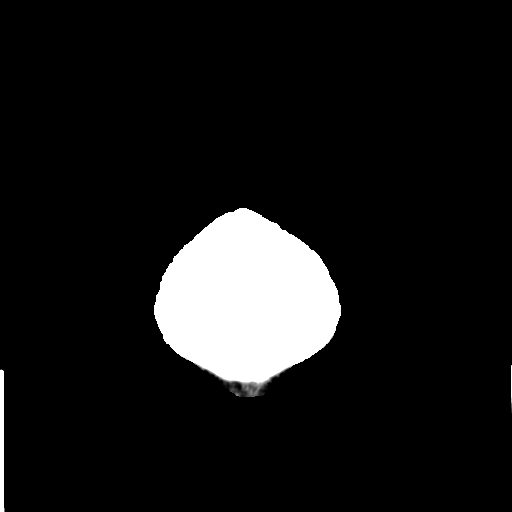

[Series 5: coronal · coronal · 0.28mm/px · 3 of 77 slices shown]
[im 26/77  brain]
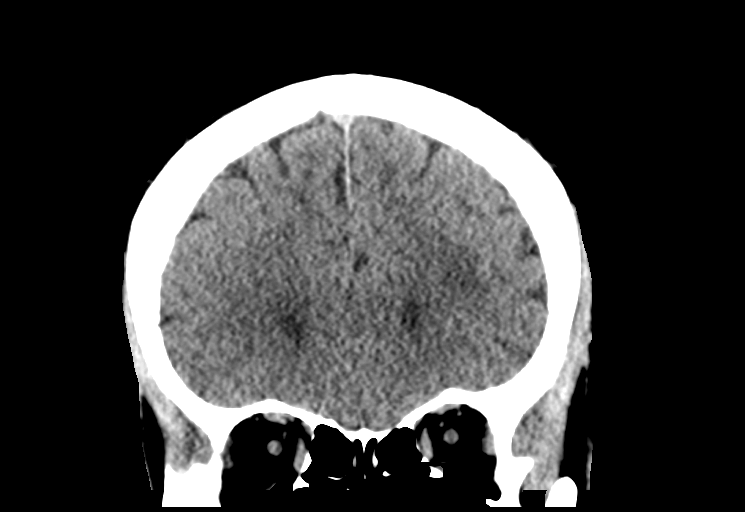
[im 34/77  brain]
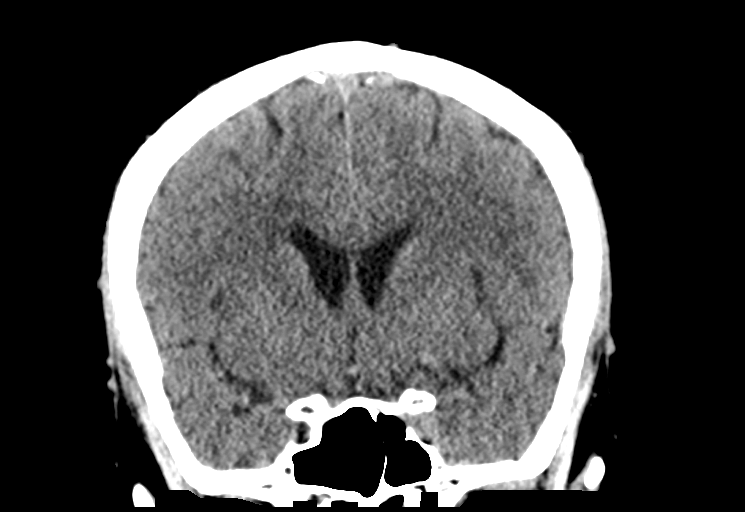
[im 43/77  brain]
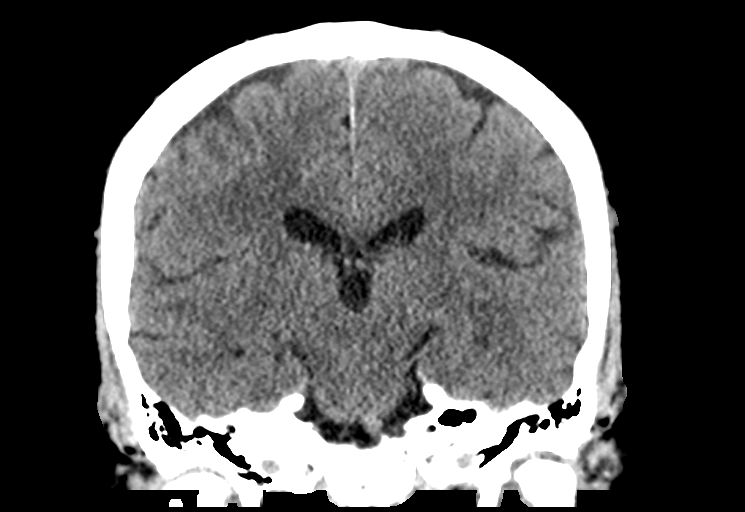

[Series 6: sagittal · sagittal · 0.28mm/px · 3 of 62 slices shown]
[im 21/62  brain]
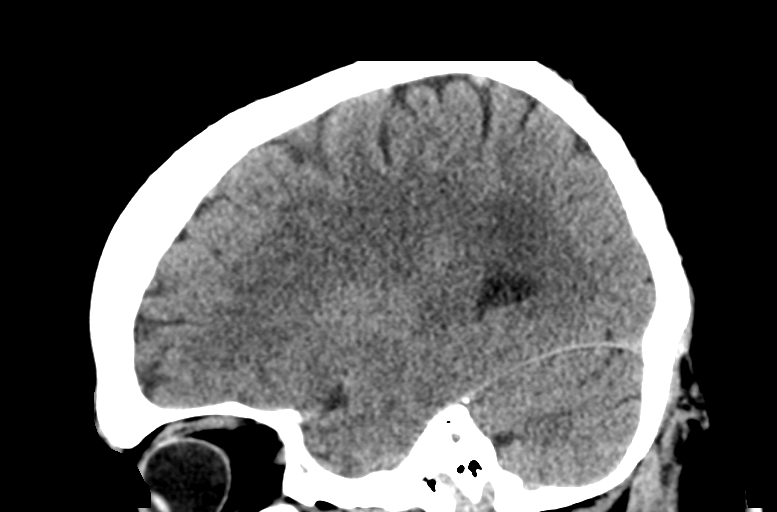
[im 31/62  brain]
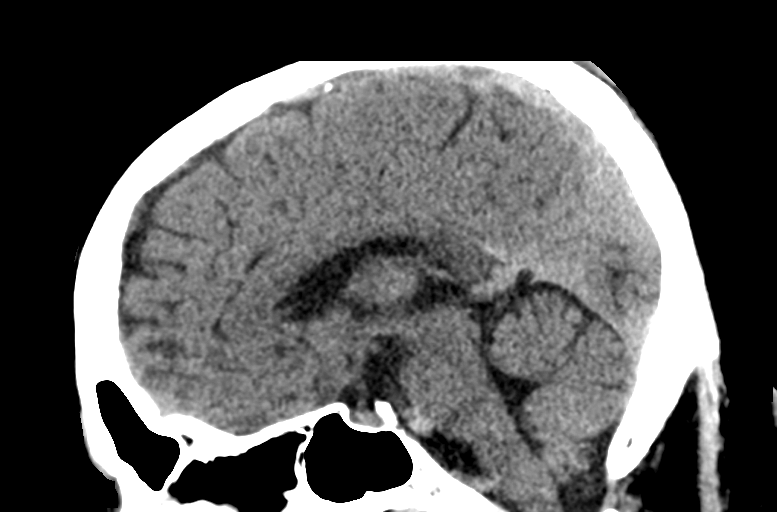
[im 41/62  brain]
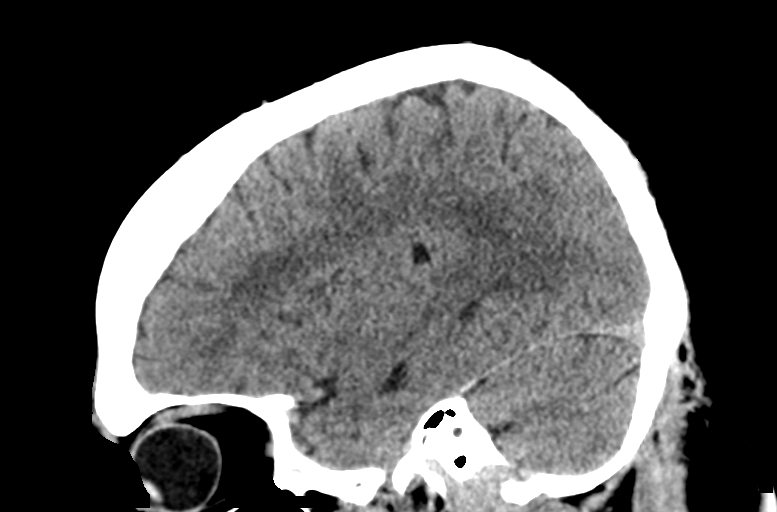

[14 of 47 positions shown; findings below may reference images not displayed]

FINDINGS: Brain: The ventricles and sulci appropriate size for patient's age.
Mild periventricular and deep white matter chronic microvascular
ischemic changes noted. There is no acute intracranial hemorrhage.
No mass effect or midline shift noted. No extra-axial fluid
collection.

Vascular: No hyperdense vessel or unexpected calcification.

Skull: Normal. Negative for fracture or focal lesion.

Sinuses/Orbits: No acute finding.

Other: None
IMPRESSION: No acute intracranial hemorrhage.

## 2018-07-27 ENCOUNTER — Encounter (INDEPENDENT_AMBULATORY_CARE_PROVIDER_SITE_OTHER): Payer: Federal, State, Local not specified - PPO | Admitting: Ophthalmology

## 2018-08-01 NOTE — Progress Notes (Signed)
[facility] - Clinic Note  08/02/2018     CHIEF COMPLAINT Patient presents for Retina Follow Up   HISTORY OF PRESENT ILLNESS: Scott Schwartz is a 67 y.o. male who presents to the clinic today for:   HPI    Retina Follow Up    Patient presents with  Retinal Break/Detachment.  In right eye.  Severity is moderate.  Duration of 3 months.  Since onset it is stable.  I, the attending physician,  performed the HPI with the patient and updated documentation appropriately.          Comments    Pt presents for horseshoe tear OD f/u (s/p laser retinopexy 06.25.19), pt states VA has been good since last visit, pt denies flashes, floaters, pain and wavy vision, pt denies the use of gtts, pt has not had A1C checked recently, pt does not check blood sugar at home       Last edited by Rennis Chris, MD on 08/02/2018 10:02 AM. (History)    Pt states he sees Dr. Jimmey Ralph for routine care; Pt states he was dx 2 years ago by Dr. Jimmey Ralph with floaters OU; Pt states he no longer is able to see the floaters as much as he should; Pt endorses dx of HTN;   Referring physician: Jarome Matin, MD [address] [place], [place] [address]  HISTORICAL INFORMATION:   Selected notes from the MEDICAL RECORD NUMBER Referred by Dr. Lavone Neri for concern of horseshoe tear OD LEE- 06.14.19 (S. Parker) [BCVA OD: 20/20-2 OS: 20/20 MRx OD: -5.75+1.50x003 OS: -5.50+0.50x158] Ocular Hx- cataract OU, glaucoma suspect OU, ERM OD, PVD OD PMH- HTN, kidney disease    CURRENT MEDICATIONS: No current outpatient medications on file. (Ophthalmic Drugs)   No current facility-administered medications for this visit.  (Ophthalmic Drugs)   Current Outpatient Medications (Other)  Medication Sig  . albuterol (PROVENTIL HFA;VENTOLIN HFA) 108 (90 Base) MCG/ACT inhaler Inhale into the lungs.  . Albuterol (PROVENTIL IN) Inhale 1-2 puffs into the lungs 3 (three) times daily.  Marland Kitchen ALPRAZolam (XANAX) 0.25 MG  tablet Take 0.25 mg by mouth at bedtime as needed for anxiety.  Marland Kitchen amLODipine (NORVASC) 5 MG tablet Take 1 tablet (5 mg total) by mouth daily.  Marland Kitchen aspirin 81 MG chewable tablet Chew 81 mg by mouth daily.  Marland Kitchen ezetimibe (ZETIA) 10 MG tablet Take 1 tablet (10 mg total) by mouth daily.  . fluticasone (FLONASE) 50 MCG/ACT nasal spray Place into both nostrils daily. Use as directed  . folic acid (FOLVITE) 1 MG tablet Take 1 mg by mouth daily.  Marland Kitchen GARCINIA CAMBOGIA-CHROMIUM PO Take 1 capsule by mouth daily.  . hydrochlorothiazide (HYDRODIURIL) 12.5 MG tablet Take 12.5 mg by mouth daily as needed (For swelling).   Marland Kitchen HYDROcodone-acetaminophen (NORCO/VICODIN) 5-325 MG tablet Take 5-325 tablets by mouth as directed. Use as directed. For pain  . Krill Oil 350 MG CAPS Take 1 capsule by mouth daily.  Marland Kitchen losartan (COZAAR) 100 MG tablet Take 1 tablet (100 mg total) by mouth daily. (Patient taking differently: Take 100 mg by mouth 2 (two) times daily. )  . metoprolol succinate (TOPROL-XL) 25 MG 24 hr tablet Take 0.5 tablets (12.5 mg total) by mouth daily. (Patient taking differently: Take 25 mg by mouth daily. )  . Misc Natural Products (TART CHERRY ADVANCED PO) Take 1 capsule by mouth daily.  . montelukast (SINGULAIR) 10 MG tablet Take 10 mg by mouth at bedtime.  . Multiple Vitamin (MULTIVITAMIN) tablet Take  1 tablet by mouth daily.  . nitroGLYCERIN (NITROLINGUAL) 0.4 MG/SPRAY spray Place 1 spray under the tongue every 5 (five) minutes x 3 doses as needed for chest pain.  Marland Kitchen omeprazole (PRILOSEC) 20 MG capsule Take 20 mg by mouth daily.  . propranolol (INDERAL) 10 MG tablet Take 10 mg by mouth 2 (two) times daily as needed. Use as directed  . sildenafil (VIAGRA) 100 MG tablet Take 100 mg by mouth daily as needed for erectile dysfunction.   No current facility-administered medications for this visit.  (Other)      REVIEW OF SYSTEMS: ROS    Positive for: Musculoskeletal, Endocrine, Cardiovascular, Eyes,  Respiratory, Psychiatric   Negative for: Constitutional, Gastrointestinal, Neurological, Skin, Genitourinary, HENT, Allergic/Imm, Heme/Lymph   Last edited by Posey Boyer, COT on 08/02/2018  9:20 AM. (History)       ALLERGIES Allergies  Allergen Reactions  . Ivp Dye [Iodinated Diagnostic Agents] Shortness Of Breath    PAST MEDICAL HISTORY Past Medical History:  Diagnosis Date  . Asthma   . Bradycardia 07/19/2015  . CAD (coronary artery disease) 2001   anterior STEMI 2001 with cutting balloon angioplasty of the mid LAD   . Claustrophobia 09/22/2013  . Diabetes mellitus without complication (HCC)   . ED (erectile dysfunction)   . GERD (gastroesophageal reflux disease)   . Glaucoma    Borderline  . History of colonoscopy 02/17/2003  . Hyperlipidemia   . Hypertension   . Nephrolithiasis   . OSA (obstructive sleep apnea) 09/22/2013   Past Surgical History:  Procedure Laterality Date  . CARDIAC CATHETERIZATION    . COLONOSCOPY  02/2003  . VASECTOMY      FAMILY HISTORY Family History  Problem Relation Age of Onset  . Leukemia Mother 50  . CAD Father 48  . CAD Brother     SOCIAL HISTORY Social History   Tobacco Use  . Smoking status: Former Smoker    Years: 7.00    Types: Cigarettes  . Smokeless tobacco: Never Used  . Tobacco comment: Pt Quit 1970's  Substance Use Topics  . Alcohol use: No  . Drug use: No         OPHTHALMIC EXAM:  Base Eye Exam    Visual Acuity (Snellen - Linear)      Right Left   Dist cc 20/30 20/25 -1   Dist ph cc 20/20 -1 20/20 -1   Correction:  Glasses       Tonometry (Tonopen, 9:25 AM)      Right Left   Pressure 26 23       Tonometry #2 (Tonopen, 9:25 AM)      Right Left   Pressure 22 25       Pupils      Dark Light Shape React APD   Right 3 2 Round Slow None   Left 3 2 Round Slow None       Visual Fields (Counting fingers)      Left Right    Full Full       Extraocular Movement      Right Left    Full, Ortho  Full, Ortho       Neuro/Psych    Oriented x3:  Yes   Mood/Affect:  Normal       Dilation    Both eyes:  1.0% Mydriacyl, 2.5% Phenylephrine @ 9:26 AM        Slit Lamp and Fundus Exam    Slit Lamp Exam  Right Left   Lids/Lashes Dermatochalasis - upper lid, Meibomian gland dysfunction Dermatochalasis - upper lid, Meibomian gland dysfunction   Conjunctiva/Sclera Mild Melanosis Mild Melanosis   Cornea Arcus, 2+ Punctate epithelial erosions, mild Anterior basement membrane dystrophy Arcus, 1+ Punctate epithelial erosions, trace Anterior basement membrane dystrophy   Anterior Chamber Deep and quiet Deep and quiet   Iris Round and dilated Round and dilated   Lens 2+ Nuclear sclerosis, 1+ Cortical cataract 2+ Nuclear sclerosis, 1+ Cortical cataract   Vitreous Vitreous syneresis, Posterior vitreous detachment Vitreous syneresis, Posterior vitreous detachment       Fundus Exam      Right Left   Disc Tilted disc, Compact, 360 Peripapillary atrophy greatest temporally Tilted disc, Temporal Peripapillary atrophy, Compact   C/D Ratio 0.4 0.3   Macula Good foveal reflex, mild Retinal pigment epithelial mottling, trace Epiretinal membrane, No heme or edema Good foveal reflex, mild Retinal pigment epithelial mottling, trace Epiretinal membrane, No heme or edema   Vessels Copper wiring, AV crossing changes, mild Vascular attenuation Copper wiring, AV crossing changes, Vascular attenuation   Periphery Attached, patch of pigmented Lattice degeneration with horseshoe tear at 0730 -- no SRF -- good laser surrounding, scattered focal RPE changes superiorly Attached, pigmented cobblestoning at 0600, trace lattice at 0600, scattered rare RPE changes superiorly          IMAGING AND PROCEDURES  Imaging and Procedures for @TODAY @  OCT, Retina - OU - Both Eyes       Right Eye Quality was good. Central Foveal Thickness: 271. Progression has been stable. Findings include normal foveal contour, no  IRF, no SRF.   Left Eye Quality was good. Central Foveal Thickness: 260. Progression has been stable. Findings include normal foveal contour, no IRF, no SRF (Trace ERM).   Notes *Images captured and stored on drive  Diagnosis / Impression:  OD: NFP, No IRF/SRF OS: NFP, No IRF/SRF  Clinical management:  See below  Abbreviations: NFP - Normal foveal profile. CME - cystoid macular edema. PED - pigment epithelial detachment. IRF - intraretinal fluid. SRF - subretinal fluid. EZ - ellipsoid zone. ERM - epiretinal membrane. ORA - outer retinal atrophy. ORT - outer retinal tubulation. SRHM - subretinal hyper-reflective material                  ASSESSMENT/PLAN:    ICD-10-CM   1. 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 Posterior vitreous detachment of both eyes H43.813   3. Essential hypertension I10   4. Hypertensive retinopathy of both eyes H35.033   5. Retinal edema H35.81 OCT, Retina - OU - Both Eyes  6. Myopia of both eyes H52.13   7. Combined forms of age-related cataract of both eyes H25.813     1. Horseshoe Retinal tear, OD   - pigmented patch of lattice with horseshoe tear at 0730 - S/P laser retinopexy OD (06.25.19) -- good laser surrounding -- stable - finished PF QID x7 days - will release pt back to Dr. Jimmey Ralph for routine eye care - F/U here PRN  2. PVD / vitreous syneresis OU  Discussed findings and prognosis  No new RT or RD on 360 scleral depressed exam  Reviewed s/s of RT/RD  Strict return precautions for any such RT/RD sings/symptoms  3, 4. Hypertensive retinopathy OU - discussed importance of tight BP control - monitor  5. No retinal edema on exam or OCT  6. Myopia OU-  - discussed increased risk of RT/RD with  myopia - management per Dr. Jimmey Ralph - monitor  7. Combined form age related cataract OU-  - The symptoms of cataract, surgical options, and treatments and risks were discussed with patient. - discussed diagnosis and  progression - not yet visually significant - monitor for now   Ophthalmic Meds Ordered this visit:  No orders of the defined types were placed in this encounter.      Return if symptoms worsen or fail to improve.  There are no Patient Instructions on file for this visit.   Explained the diagnoses, plan, and follow up with the patient and they expressed understanding.  Patient expressed understanding of the importance of proper follow up care.   This document serves as a record of services personally performed by Karie Chimera, MD, PhD. It was created on their behalf by Virgilio Belling, COA, a certified ophthalmic assistant. The creation of this record is the provider's dictation and/or activities during the visit.  Electronically signed by: Virgilio Belling, COA  10.14.19 1:18 PM   Karie Chimera, M.D., Ph.D. Diseases & Surgery of the Retina and Vitreous [facility]   I have reviewed the above documentation for accuracy and completeness, and I agree with the above. Karie Chimera, M.D., Ph.D. 08/02/18 1:20 PM   Abbreviations: M myopia (nearsighted); A astigmatism; H hyperopia (farsighted); P presbyopia; Mrx spectacle prescription;  CTL contact lenses; OD right eye; OS left eye; OU both eyes  XT exotropia; ET esotropia; PEK punctate epithelial keratitis; PEE punctate epithelial erosions; DES dry eye syndrome; MGD meibomian gland dysfunction; ATs artificial tears; PFAT's preservative free artificial tears; NSC nuclear sclerotic cataract; PSC posterior subcapsular cataract; ERM epi-retinal membrane; PVD posterior vitreous detachment; RD retinal detachment; DM diabetes mellitus; DR diabetic retinopathy; NPDR non-proliferative diabetic retinopathy; PDR proliferative diabetic retinopathy; CSME clinically significant macular edema; DME diabetic macular edema; dbh dot blot hemorrhages; CWS cotton wool spot; POAG primary open angle glaucoma; C/D cup-to-disc ratio; HVF  humphrey visual field; GVF goldmann visual field; OCT optical coherence tomography; IOP intraocular pressure; BRVO Branch retinal vein occlusion; CRVO central retinal vein occlusion; CRAO central retinal artery occlusion; BRAO branch retinal artery occlusion; RT retinal tear; SB scleral buckle; PPV pars plana vitrectomy; VH Vitreous hemorrhage; PRP panretinal laser photocoagulation; IVK intravitreal kenalog; VMT vitreomacular traction; MH Macular hole;  NVD neovascularization of the disc; NVE neovascularization elsewhere; AREDS age related eye disease study; ARMD age related macular degeneration; POAG primary open angle glaucoma; EBMD epithelial/anterior basement membrane dystrophy; ACIOL anterior chamber intraocular lens; IOL intraocular lens; PCIOL posterior chamber intraocular lens; Phaco/IOL phacoemulsification with intraocular lens placement; PRK photorefractive keratectomy; LASIK laser assisted in situ keratomileusis; HTN hypertension; DM diabetes mellitus; COPD chronic obstructive pulmonary disease

## 2018-08-02 ENCOUNTER — Encounter (INDEPENDENT_AMBULATORY_CARE_PROVIDER_SITE_OTHER): Payer: Self-pay | Admitting: Ophthalmology

## 2018-08-02 ENCOUNTER — Ambulatory Visit (INDEPENDENT_AMBULATORY_CARE_PROVIDER_SITE_OTHER): Payer: Federal, State, Local not specified - PPO | Admitting: Ophthalmology

## 2018-08-02 DIAGNOSIS — H3581 Retinal edema: Secondary | ICD-10-CM

## 2018-08-02 DIAGNOSIS — H43813 Vitreous degeneration, bilateral: Secondary | ICD-10-CM

## 2018-08-02 DIAGNOSIS — I10 Essential (primary) hypertension: Secondary | ICD-10-CM | POA: Diagnosis not present

## 2018-08-02 DIAGNOSIS — H33311 Horseshoe tear of retina without detachment, right eye: Secondary | ICD-10-CM | POA: Diagnosis not present

## 2018-08-02 DIAGNOSIS — H5213 Myopia, bilateral: Secondary | ICD-10-CM

## 2018-08-02 DIAGNOSIS — H25813 Combined forms of age-related cataract, bilateral: Secondary | ICD-10-CM

## 2018-08-02 DIAGNOSIS — H35033 Hypertensive retinopathy, bilateral: Secondary | ICD-10-CM

## 2018-09-09 ENCOUNTER — Other Ambulatory Visit: Payer: Self-pay | Admitting: Cardiology

## 2018-10-04 ENCOUNTER — Encounter: Payer: Self-pay | Admitting: Physician Assistant

## 2018-10-04 NOTE — Progress Notes (Signed)
Cardiology Office Note    Date:  10/05/2018  ID:  Scott Schwartz, DOB 01/21/1951, MRN [id number] PCP:  Leanna Battles, MD  Cardiologist:  Fransico Him, MD   Chief Complaint: routine 1 year f/u  History of Present Illness:  Scott Schwartz ("YOURS") is a 67 y.o. male with history of CAD and remote anterior STEMI in 2001 s/p cutting balloon angioplasty of the mid LAD and PTCA of ostial diagonal,OSA on CPAP (followed by neurology), GERD, HTN, dyslipidemia, asthma, bradycardia, claustrophobia, ED, nephrolithasis. Cardiac history is limited in the chart. He had a discharge summary from 2001 that outlines his anatomy, with 75% LAD and mid after diagonal 1, and a 75% ostial diagonal 1. He underwent cutting balloon PTCA of his LAD, reduced from 75% to 10% and balloon PTCA of his ostial diagonal. EF at time of cath was reported to be 40%, with normalization by echo afterwards. I do not have these official reports to view. He has not required any repeat testing since that time. He was previously referred to the lipid clinic to discuss Zetia and PCSK9s. He saw them 04/2018 but did not follow up as recommended. Last labs by [facility] 09/27/18 showed LDL 100, Tchol 179, HDL 32, trig 27, A1C 5.2, glucose 111, BUN 20, Cr 1.10. No recent LFTs on file.  He has had a good year from a cardiac standpoint and feels great. No chest pain, SOB, palpitations, edema, dizziness, lightheadedness or syncope. He indicates his BP periodically increases and his PCP had asked him to increase losartan from 169m daily to BID but he is not routinely taking this twice daily. He used to take propranolol PRN for anxiety but has not taken this recently. His brother recently had valve surgery and has been exercising regularly. He feels motivated to join the [employer] and get back into exercise because of this. 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  Past Medical  History:  Diagnosis Date  . Asthma   . Bradycardia 07/19/2015  . CAD (coronary artery disease) 2001   AD and remote anterior STEMI in 2001 s/p cutting balloon angioplasty of the mid LAD and PTCA of ostial diagonal  . Claustrophobia 09/22/2013  . Diabetes mellitus ([facility]   . ED (erectile dysfunction)   . GERD (gastroesophageal reflux disease)   . Glaucoma    Borderline  . History of colonoscopy 02/17/2003  . Hyperlipidemia   . Hypertension   . Ischemic cardiomyopathy    a. EF 40% at time of MI 2001, then normal by f/u echo at that time.  . Nephrolithiasis   . OSA (obstructive sleep apnea) 09/22/2013    Past Surgical History:  Procedure Laterality Date  . CARDIAC CATHETERIZATION    . COLONOSCOPY  02/2003  . VASECTOMY      Current Medications: Current Meds  Medication Sig  . albuterol (PROVENTIL HFA;VENTOLIN HFA) 108 (90 Base) MCG/ACT inhaler Inhale 2 puffs into the lungs every 6 (six) hours as needed for wheezing or shortness of breath.  . ALPRAZolam (XANAX) 0.25 MG tablet Take 0.25 mg by mouth at bedtime as needed for anxiety.  .Marland KitchenamLODipine (NORVASC) 5 MG tablet Take 1 tablet (5 mg total) by mouth daily. Please call and schedule an appointment for further refills 1st attempt  . aspirin 81 MG chewable tablet Chew 81 mg by mouth daily.  . fluticasone (FLONASE) 50 MCG/ACT nasal spray Place into both nostrils as needed.  Use as directed  . folic acid (FOLVITE) 1 MG tablet Take 1 mg by mouth daily.  Marland Kitchen GARCINIA CAMBOGIA-CHROMIUM PO Take 1 capsule by mouth daily.  . hydrochlorothiazide (HYDRODIURIL) 12.5 MG tablet Take 12.5 mg by mouth daily as needed (For swelling).   Marland Kitchen HYDROcodone-acetaminophen (NORCO/VICODIN) 5-325 MG tablet Take 5-325 tablets by mouth as directed. Use as directed. For pain  . Krill Oil 350 MG CAPS Take 1 capsule by mouth daily.  Marland Kitchen losartan (COZAAR) 100 MG tablet Take 100 mg by mouth 2 (two) times daily.  . metoprolol succinate (TOPROL-XL) 25 MG 24 hr tablet Take 0.5  tablets (12.5 mg total) by mouth daily.  . Misc Natural Products (TART CHERRY ADVANCED PO) Take 1 capsule by mouth daily.  . montelukast (SINGULAIR) 10 MG tablet Take 10 mg by mouth at bedtime.  . Multiple Vitamin (MULTIVITAMIN) tablet Take 1 tablet by mouth daily.  . nitroGLYCERIN (NITROLINGUAL) 0.4 MG/SPRAY spray Place 1 spray under the tongue every 5 (five) minutes x 3 doses as needed for chest pain.  Marland Kitchen omeprazole (PRILOSEC) 20 MG capsule Take 20 mg by mouth daily.  . propranolol (INDERAL) 10 MG tablet Take 10 mg by mouth 2 (two) times daily as needed. Use as directed  . sildenafil (VIAGRA) 100 MG tablet Take 100 mg by mouth daily as needed for erectile dysfunction.    Allergies:   Ivp dye [iodinated diagnostic agents]   Social History   Socioeconomic History  . Marital status: Single    Spouse name: Not on file  . Number of children: 3  . Years of education: Not on file  . Highest education level: Not on file  Occupational History  . Occupation: RET    Comment: POSTAL  Social Needs  . Financial resource strain: Not on file  . Food insecurity:    Worry: Not on file    Inability: Not on file  . Transportation needs:    Medical: Not on file    Non-medical: Not on file  Tobacco Use  . Smoking status: Former Smoker    Years: 7.00    Types: Cigarettes  . Smokeless tobacco: Never Used  . Tobacco comment: Pt Quit 1970's  Substance and Sexual Activity  . Alcohol use: No  . Drug use: No  . Sexual activity: Not on file  Lifestyle  . Physical activity:    Days per week: Not on file    Minutes per session: Not on file  . Stress: Not on file  Relationships  . Social connections:    Talks on phone: Not on file    Gets together: Not on file    Attends religious service: Not on file    Active member of club or organization: Not on file    Attends meetings of clubs or organizations: Not on file    Relationship status: Not on file  Other Topics Concern  . Not on file  Social  History Narrative   Pt married 3 children    1 grandchild   Ret [employer]   Former smoker 1 pack x's 2 yrs    Quit in 56's     Family History:  The patient's family history includes CAD in his brother; CAD (age of onset: 18) in his father; Leukemia (age of onset: 46) in his mother.  ROS:   Please see the history of present illness. + intentional weight loss All other systems are reviewed and otherwise negative.    PHYSICAL EXAM:  VS:  BP (!) 150/88   Pulse (!) 51   Ht 5' 6.5" (1.689 m)   Wt 174 lb 1.9 oz (79 kg)   SpO2 96%   BMI 27.68 kg/m   BMI: Body mass index is 27.68 kg/m. GEN: Well nourished, well developed AAM in no acute distress, cheerful HEENT: normocephalic, atraumatic Neck: no JVD, carotid bruits, or masses Cardiac: RRR; no murmurs, rubs, or gallops, no edema  Respiratory:  clear to auscultation bilaterally, normal work of breathing GI: soft, nontender, nondistended, + BS MS: no deformity or atrophy Skin: warm and dry, no rash Neuro:  Alert and Oriented x 3, Strength and sensation are intact, follows commands Psych: euthymic mood, full affect  Wt Readings from Last 3 Encounters:  10/05/18 174 lb 1.9 oz (79 kg)  06/16/18 173 lb 9.6 oz (78.7 kg)  09/03/17 180 lb 8 oz (81.9 kg)      Studies/Labs Reviewed:   EKG:  EKG was ordered today and personally reviewed by me and demonstrates sinus bradycardia 51bpm, TWI III otherwise normal  Recent Labs: No results found for requested labs within last 8760 hours.   Lipid Panel    Component Value Date/Time   CHOL 137 07/16/2016 1119   TRIG 111 07/16/2016 1119   HDL 29 (L) 07/16/2016 1119   CHOLHDL 4.7 07/16/2016 1119   VLDL 22 07/16/2016 1119   LDLCALC 86 07/16/2016 1119    Additional studies/ records that were reviewed today include: Summarized above.   ASSESSMENT & PLAN:   1. CAD - doing very well. No recent anginal symptoms. Continue secondary prevention with BP management and lipid control as  below.  2. Essential HTN - recheck by me was 138/74. He indicates primary care had asked him to increase losartan from 1547m daily to BID but the max dose is typically 10247mdaily. He has not been regularly taking BID, only once a day. I think a better approach would be to instead titrate his amlodipine from 47m60mo 109m39mily. The patient was instructed to monitor their blood pressure at home and to call if tending to run higher than 130 287tolic or 80 diastolic, at which time I suspect addition of a standing diuretic would be appropriate. He uses HCTZ prn but only sparingly. I also think regular exercise would help his BP as well. 3. Dyslipidemia - he has been resistant to idea of statins in the past. PharmD previously advised Zetia but he never tried this out of fear of side effects. He is willing to reconsider. Start 109mg3mly with f/u CMET/lipids in 6 weeks. I also discussed that although no clinical trials to support definitive help, some anecdotal office experience between myself and other cardiologists has indicated some patients do better with CoQ10 coadministration. If he does not tolerate Zetia or LDL is suboptimally controlled on f/u labs, will need to revisit pharmD clinic to discuss initiation of PCSK9 inhibitors. 4. Ischemic cardiomyopathy - no signs of CHF on exam. Continue to manage BP as above. 5. Sinus bradycardia - HR appears chronically in the 50s. He is asymptomatic with this. He is on very low dose metoprolol given h/o MI and ICM. Since he is tolerating this I will not make any changes today. He also has propranolol listed PRN but does not regularly take this so we will remove from his list altogether.  Disposition: F/u with Dr. TurneRadford Pax year.  Medication Adjustments/Labs and Tests Ordered: Current medicines are reviewed at length with the patient today.  Concerns regarding medicines are outlined above. Medication changes, Labs and Tests ordered today are summarized above and  listed in the Patient Instructions accessible in Encounters.   Signed, Charlie Pitter, PA-C  10/05/2018 10:32 AM    [facility] [address], [place], [place]  [address] Phone: [phone]; Fax: [phone]

## 2018-10-05 ENCOUNTER — Ambulatory Visit: Payer: Federal, State, Local not specified - PPO | Admitting: Physician Assistant

## 2018-10-05 ENCOUNTER — Encounter: Payer: Self-pay | Admitting: Physician Assistant

## 2018-10-05 VITALS — BP 150/88 | HR 51 | Ht 66.5 in | Wt 174.1 lb

## 2018-10-05 DIAGNOSIS — I10 Essential (primary) hypertension: Secondary | ICD-10-CM | POA: Diagnosis not present

## 2018-10-05 DIAGNOSIS — R001 Bradycardia, unspecified: Secondary | ICD-10-CM

## 2018-10-05 DIAGNOSIS — I255 Ischemic cardiomyopathy: Secondary | ICD-10-CM | POA: Diagnosis not present

## 2018-10-05 DIAGNOSIS — E785 Hyperlipidemia, unspecified: Secondary | ICD-10-CM | POA: Diagnosis not present

## 2018-10-05 DIAGNOSIS — I2510 Atherosclerotic heart disease of native coronary artery without angina pectoris: Secondary | ICD-10-CM | POA: Diagnosis not present

## 2018-10-05 MED ORDER — EZETIMIBE 10 MG PO TABS: 10.0000 mg | ORAL_TABLET | Freq: Every day | ORAL | 6 refills | Status: DC

## 2018-10-05 MED ORDER — LOSARTAN POTASSIUM 100 MG PO TABS: 100.0000 mg | ORAL_TABLET | Freq: Every day | ORAL | 3 refills | Status: DC

## 2018-10-05 MED ORDER — AMLODIPINE BESYLATE 10 MG PO TABS: 10.0000 mg | ORAL_TABLET | Freq: Every day | ORAL | 3 refills | Status: DC

## 2018-10-05 NOTE — Patient Instructions (Addendum)
Medication Instructions:  Your physician has recommended you make the following change in your medication:  1.  START Zetia 10 mg daily 2.  DECREASE the Losartan to 100 mg taking only 1 tablet daily 3.  STOP the Proproanolol 4.  INCREASE the Amlodipine to 10 mg taking 1 tablet daily  If you need a refill on your cardiac medications before your next appointment, please call your pharmacy.   Lab work: 6 WEEKS: 11-17-18 MAKE SURE TO COME FASTING:   CMET & LIPIDS  If you have labs (blood work) drawn today and your tests are completely normal, you will receive your results only by: Marland Kitchen. MyChart Message (if you have MyChart) OR . A paper copy in the mail If you have any lab test that is abnormal or we need to change your treatment, we will call you to review the results.  Testing/Procedures: None ordered  Follow-Up: At [facility], you and your health needs are our priority.  As part of our continuing mission to provide you with exceptional heart care, we have created designated Provider Care Teams.  These Care Teams include your primary Cardiologist (physician) and Advanced Practice Providers (APPs -  Physician Assistants and Nurse Practitioners) who all work together to provide you with the care you need, when you need it. You will need a follow up appointment in 12 months.  Please call our office 2 months in advance to schedule this appointment.  You may see Armanda Magicraci Turner, MD or one of the following Advanced Practice Providers on your designated Care Team:   [place] Simmons, PA-C Ronie Spiesayna Dunn, PA-C . Jacolyn ReedyMichele Lenze, PA-C  Any Other Special Instructions Will Be Listed Below (If Applicable).   Please monitor your blood pressure occasionally at home. Call your doctor if you tend to get readings of greater than 130 on the top number or 80 on the bottom number.  If you notice muscle aches or fatigue on the Zetia (ezetimibe), you can try taking over-the-counter Co-Q10 (100-200mg  daily).

## 2018-10-24 ENCOUNTER — Ambulatory Visit (INDEPENDENT_AMBULATORY_CARE_PROVIDER_SITE_OTHER): Payer: Self-pay

## 2018-10-24 ENCOUNTER — Encounter (INDEPENDENT_AMBULATORY_CARE_PROVIDER_SITE_OTHER): Payer: Self-pay | Admitting: Orthopedic Surgery

## 2018-10-24 ENCOUNTER — Ambulatory Visit (INDEPENDENT_AMBULATORY_CARE_PROVIDER_SITE_OTHER): Payer: Federal, State, Local not specified - PPO | Admitting: Physician Assistant

## 2018-10-24 VITALS — Ht 66.0 in | Wt 174.0 lb

## 2018-10-24 DIAGNOSIS — M25512 Pain in left shoulder: Secondary | ICD-10-CM | POA: Diagnosis not present

## 2018-10-24 DIAGNOSIS — M7542 Impingement syndrome of left shoulder: Secondary | ICD-10-CM | POA: Diagnosis not present

## 2018-10-24 DIAGNOSIS — G8929 Other chronic pain: Secondary | ICD-10-CM

## 2018-10-24 NOTE — Progress Notes (Signed)
Office Visit Note   Patient: Scott Schwartz           Date of Birth: 22-Feb-1951           MRN: [id number] Visit Date: 10/24/2018              Requested by: Leanna Battles, [address] [place], [place] [address] PCP: Leanna Battles, MD  Chief Complaint  Patient presents with  . Left Shoulder - Pain      HPI: The patient is a 68 year old gentleman who is seen for complaint of left shoulder pain.  He reports that earlier in the summer he noticed he was having some discomfort in his left shoulder.  He particularly noticed this in the nighttime as he would normally use his left hand to reach up and massage his wife scalp when she was trying to fall asleep each night. He reports pain reaching over head and reaching back behind him to put his shirt sleeve on. He notes popping of the shoulder at times. He has trying a little tylenol but otherwise has just been trying to manage but the pain is getting less tolerable and more persistent.   Assessment & Plan: Visit Diagnoses:  1. Shoulder impingement syndrome, left   2. Chronic left shoulder pain     Plan: After informed consent the left shoulder was injected with lidocaine and Depo medrol and the patient tolerated this well. He will follow up in about 4 weeks.   Follow-Up Instructions: Return in about 4 weeks (around 11/21/2018).   Ortho Exam  Patient is alert, oriented, no adenopathy, well-dressed, normal affect, normal respiratory effort. The left shoulder range of motion is decreased in forward flexion and abduction and internal rotation. He has painful motion with these movements and + impingement signs. Neurovascular intact.   Imaging: No results found. No images are attached to the encounter.  Labs: No results found for: HGBA1C, ESRSEDRATE, CRP, LABURIC, REPTSTATUS, GRAMSTAIN, CULT, LABORGA   Lab Results  Component Value Date   ALBUMIN 4.3 07/16/2016    Body mass index is 28.08 kg/m.  Orders:  Orders Placed  This Encounter  Procedures  . Large Joint Inj: L subacromial bursa  . XR Shoulder Left   No orders of the defined types were placed in this encounter.    Procedures: Large Joint Inj: L subacromial bursa on 10/24/2018 9:41 AM Indications: diagnostic evaluation and pain Details: 22 G 1.5 in needle, posterior approach  Arthrogram: No  Medications: 5 mL lidocaine 1 %; 40 mg methylPREDNISolone acetate 40 MG/ML Outcome: tolerated well, no immediate complications Procedure, treatment alternatives, risks and benefits explained, specific risks discussed. Consent was given by the patient. Immediately prior to procedure a time out was called to verify the correct patient, procedure, equipment, support staff and site/side marked as required. Patient was prepped and draped in the usual sterile fashion.      Clinical Data: No additional findings.  ROS:  All other systems negative, except as noted in the HPI. Review of Systems  Objective: Vital Signs: Ht _0  (1.676 m)   Wt 174 lb (78.9 kg)   BMI 28.08 kg/m   Specialty Comments:  No specialty comments available.  PMFS History: Patient Active Problem List   Diagnosis Date Noted  . Shoulder impingement syndrome, left 10/24/2018  . CPAP use counseling 06/15/2017  . OSA on CPAP 04/01/2016  . Bradycardia 07/19/2015  . Edema extremities 07/05/2014  . Benign essential HTN 07/05/2014  . Dyslipidemia 07/05/2014  .  CAD (coronary artery disease)   . Nephrolithiasis   . Diabetes mellitus without complication ([place])   . Asthma   . Claustrophobia 09/22/2013   Past Medical History:  Diagnosis Date  . Asthma   . Bradycardia 07/19/2015  . CAD (coronary artery disease) 2001   AD and remote anterior STEMI in 2001 s/p cutting balloon angioplasty of the mid LAD and PTCA of ostial diagonal  . Claustrophobia 09/22/2013  . Diabetes mellitus ([place])   . ED (erectile dysfunction)   . GERD (gastroesophageal reflux disease)   . Glaucoma     Borderline  . History of colonoscopy 02/17/2003  . Hyperlipidemia   . Hypertension   . Ischemic cardiomyopathy    a. EF 40% at time of MI 2001, then normal by f/u echo at that time.  . Nephrolithiasis   . OSA (obstructive sleep apnea) 09/22/2013    Family History  Problem Relation Age of Onset  . Leukemia Mother 44  . CAD Father 67  . CAD Brother     Past Surgical History:  Procedure Laterality Date  . CARDIAC CATHETERIZATION    . COLONOSCOPY  02/2003  . VASECTOMY     Social History   Occupational History  . Occupation: RET    Comment: POSTAL  Tobacco Use  . Smoking status: Former Smoker    Years: 7.00    Types: Cigarettes  . Smokeless tobacco: Never Used  . Tobacco comment: Pt Quit 1970's  Substance and Sexual Activity  . Alcohol use: No  . Drug use: No  . Sexual activity: Not on file

## 2018-10-25 ENCOUNTER — Encounter (INDEPENDENT_AMBULATORY_CARE_PROVIDER_SITE_OTHER): Payer: Self-pay | Admitting: Physician Assistant

## 2018-10-25 MED ORDER — LIDOCAINE HCL 1 % IJ SOLN
5.0000 mL | INTRAMUSCULAR | Status: AC | PRN
  Administered 2018-10-24: 5 mL

## 2018-10-25 MED ORDER — METHYLPREDNISOLONE ACETATE 40 MG/ML IJ SUSP
40.0000 mg | INTRAMUSCULAR | Status: AC | PRN
  Administered 2018-10-24: 40 mg via INTRA_ARTICULAR

## 2018-11-17 ENCOUNTER — Other Ambulatory Visit: Payer: Federal, State, Local not specified - PPO | Admitting: *Deleted

## 2018-11-17 DIAGNOSIS — E785 Hyperlipidemia, unspecified: Secondary | ICD-10-CM

## 2018-11-17 DIAGNOSIS — I2510 Atherosclerotic heart disease of native coronary artery without angina pectoris: Secondary | ICD-10-CM

## 2018-11-17 DIAGNOSIS — I255 Ischemic cardiomyopathy: Secondary | ICD-10-CM

## 2018-11-17 DIAGNOSIS — I10 Essential (primary) hypertension: Secondary | ICD-10-CM

## 2018-11-17 LAB — COMPREHENSIVE METABOLIC PANEL
A/G RATIO: 2.1 (ref 1.2–2.2)
ALT: 19 IU/L (ref 0–44)
AST: 15 IU/L (ref 0–40)
Albumin: 4.4 g/dL (ref 3.8–4.8)
Alkaline Phosphatase: 57 IU/L (ref 39–117)
BUN/Creatinine Ratio: 15 (ref 10–24)
BUN: 17 mg/dL (ref 8–27)
Bilirubin Total: 0.5 mg/dL (ref 0.0–1.2)
CO2: 24 mmol/L (ref 20–29)
CREATININE: 1.15 mg/dL (ref 0.76–1.27)
Calcium: 9.1 mg/dL (ref 8.6–10.2)
Chloride: 100 mmol/L (ref 96–106)
GFR, EST AFRICAN AMERICAN: 76 mL/min/{1.73_m2} (ref >59)
GFR, EST NON AFRICAN AMERICAN: 66 mL/min/{1.73_m2} (ref >59)
GLOBULIN, TOTAL: 2.1 g/dL (ref 1.5–4.5)
Glucose: 106 mg/dL — ABNORMAL HIGH (ref 65–99)
POTASSIUM: 4.2 mmol/L (ref 3.5–5.2)
SODIUM: 139 mmol/L (ref 134–144)
TOTAL PROTEIN: 6.5 g/dL (ref 6.0–8.5)

## 2018-11-17 LAB — LIPID PANEL
CHOL/HDL RATIO: 3 ratio (ref 0.0–5.0)
Cholesterol, Total: 107 mg/dL (ref 100–199)
HDL: 36 mg/dL — AB (ref >39)
LDL CALC: 47 mg/dL (ref 0–99)
TRIGLYCERIDES: 121 mg/dL (ref 0–149)
VLDL Cholesterol Cal: 24 mg/dL (ref 5–40)

## 2018-11-18 ENCOUNTER — Telehealth: Payer: Self-pay

## 2018-11-18 ENCOUNTER — Telehealth: Payer: Self-pay | Admitting: *Deleted

## 2018-11-18 NOTE — Telephone Encounter (Signed)
Patient notified of result.  Please refer to phone note from today for complete details.   Danielle Rankin, CMA 11/18/2018 3:50 PM  Pt has been advised to increase exercise can help boost up HDL as well as increase healthy fats in diet, like olive oli, fish, nuts etc. Pt asked if we will let him know when to make an appt. I advised looks like when he saw Ronie Spies, PA 09/2018 she said to f/u in 1 yr, so we will send out a letter 2-3 months ahead to call and make appt. Pt thanked me for the call.

## 2018-11-18 NOTE — Telephone Encounter (Signed)
Pt came in requesting his lab results from yesterday.. I advised him that they are back but we are waiting for his provider to review and give [place] recommendations but not seeing anything abnormal to put his mind at ease..  I offered him My Chart info and code and he was very thankful because he would like to view his results even after he is called from the office... reassured him we will let him know if any changes once Ronie Spies PA has time to review. Pt verbalized understanding and agreed.

## 2018-11-21 ENCOUNTER — Encounter (INDEPENDENT_AMBULATORY_CARE_PROVIDER_SITE_OTHER): Payer: Self-pay | Admitting: Orthopedic Surgery

## 2018-11-21 ENCOUNTER — Ambulatory Visit (INDEPENDENT_AMBULATORY_CARE_PROVIDER_SITE_OTHER): Payer: Federal, State, Local not specified - PPO | Admitting: Physician Assistant

## 2018-11-21 VITALS — Ht 66.0 in | Wt 174.0 lb

## 2018-11-21 DIAGNOSIS — M7542 Impingement syndrome of left shoulder: Secondary | ICD-10-CM | POA: Diagnosis not present

## 2018-11-25 ENCOUNTER — Encounter (INDEPENDENT_AMBULATORY_CARE_PROVIDER_SITE_OTHER): Payer: Self-pay | Admitting: Physician Assistant

## 2018-11-25 NOTE — Progress Notes (Signed)
Office Visit Note   Patient: Scott Schwartz           Date of Birth: 08/27/1951           MRN: [id number] Visit Date: 11/21/2018              Requested by: Leanna Battles, [address], [place] [address] PCP: Leanna Battles, MD  Chief Complaint  Patient presents with  . Left Shoulder - Follow-up, Pain      HPI: The patient is a 68 year old gentleman who is seen for follow-up following a left shoulder injection for impingement syndrome.  He reports that he is markedly improved.  His range of motion is much improved and he has been doing a home exercise program for rotator cuff strengthening and reports this is going well.  He reports he still trying to avoid lifting anything heavy.  He is not having to take any medications but does note some occasional pain on extremes of motion or when he tries to lift something heavier. Assessment & Plan: Visit Diagnoses:  1. Shoulder impingement syndrome, left     Plan: Patient will continue to work on his home exercise program for strengthening the rotator cuff.  He is can see how he does over the next several months and follow-up on an as-needed basis should he have recurrent pain.  We did discuss that we could repeat a steroid injection up to several times a year and if the symptoms become persistent enough that he might require arthroscopic debridement but at this point he is doing extremely well.  Follow-Up Instructions: Return if symptoms worsen or fail to improve.   Ortho Exam  Patient is alert, oriented, no adenopathy, well-dressed, normal affect, normal respiratory effort. Left shoulder shows markedly improved range of motion with good forward flexion internal and external rotation which is pain-free.  He has negative drop arm.  He is neurovascularly intact throughout the left upper extremity.  Imaging: No results found. No images are attached to the encounter.  Labs: No results found for: HGBA1C, ESRSEDRATE, CRP,  LABURIC, REPTSTATUS, GRAMSTAIN, CULT, LABORGA   Lab Results  Component Value Date   ALBUMIN 4.4 11/17/2018   ALBUMIN 4.3 07/16/2016    Body mass index is 28.08 kg/m.  Orders:  No orders of the defined types were placed in this encounter.  No orders of the defined types were placed in this encounter.    Procedures: No procedures performed  Clinical Data: No additional findings.  ROS:  All other systems negative, except as noted in the HPI. Review of Systems  Objective: Vital Signs: Ht _0  (1.676 m)   Wt 174 lb (78.9 kg)   BMI 28.08 kg/m   Specialty Comments:  No specialty comments available.  PMFS History: Patient Active Problem List   Diagnosis Date Noted  . Shoulder impingement syndrome, left 10/24/2018  . CPAP use counseling 06/15/2017  . OSA on CPAP 04/01/2016  . Bradycardia 07/19/2015  . Edema extremities 07/05/2014  . Benign essential HTN 07/05/2014  . Dyslipidemia 07/05/2014  . CAD (coronary artery disease)   . Nephrolithiasis   . Diabetes mellitus without complication ([place])   . Asthma   . Claustrophobia 09/22/2013   Past Medical History:  Diagnosis Date  . Asthma   . Bradycardia 07/19/2015  . CAD (coronary artery disease) 2001   AD and remote anterior STEMI in 2001 s/p cutting balloon angioplasty of the mid LAD and PTCA of ostial diagonal  . Claustrophobia 09/22/2013  .  Diabetes mellitus ([place])   . ED (erectile dysfunction)   . GERD (gastroesophageal reflux disease)   . Glaucoma    Borderline  . History of colonoscopy 02/17/2003  . Hyperlipidemia   . Hypertension   . Ischemic cardiomyopathy    a. EF 40% at time of MI 2001, then normal by f/u echo at that time.  . Nephrolithiasis   . OSA (obstructive sleep apnea) 09/22/2013    Family History  Problem Relation Age of Onset  . Leukemia Mother 55  . CAD Father 22  . CAD Brother     Past Surgical History:  Procedure Laterality Date  . CARDIAC CATHETERIZATION    . COLONOSCOPY  02/2003  .  VASECTOMY     Social History   Occupational History  . Occupation: RET    Comment: POSTAL  Tobacco Use  . Smoking status: Former Smoker    Years: 7.00    Types: Cigarettes  . Smokeless tobacco: Never Used  . Tobacco comment: Pt Quit 1970's  Substance and Sexual Activity  . Alcohol use: No  . Drug use: No  . Sexual activity: Not on file

## 2019-02-16 MED ORDER — HYDROCHLOROTHIAZIDE 12.5 MG PO TABS: 12.5000 mg | ORAL_TABLET | Freq: Every day | ORAL | 1 refills | Status: DC | PRN

## 2019-02-16 NOTE — Telephone Encounter (Signed)
Please call patient - thank him for writing in today. We are happy to help. Just need a little more information. Does he know what his BP has been running, or if he's noticed any significant weight changes? I also would like him to verify what medication he is referring to. I do see he previously had an "HCTZ 12.5mg  daily as needed for swelling" on file. If this is the medication he is referring to, go ahead and send in a refill to his preferred pharmacy. If the HCTZ does not solve the swelling we might need to think about adjusting his regimen since amlodipine can cause a little swelling as well. Keep me posted. Tarra Pence PA-C

## 2019-02-16 NOTE — Telephone Encounter (Signed)
Spoke with pt.  Pt states that he hasn't been following his bp lately, but feels it has been running ok. Pt weight is up to 189 and normal is 174 Pt will check his bp when he picks up his Hctz 12.5 from his pharmacy and call us if it is abnormal.

## 2019-02-16 NOTE — Telephone Encounter (Addendum)
I called pt to clarify severity of swelling given weight gain mentioned below. It sounds like he misspoke, he has gone up from 174 to 179, not 189. He also admits eating ice cream nightly and being less active since Covid pandemic but will be making healthier choices moving forward. His swelling is very mild so will carry out plan as discussed below. Otherwise he has no other signs of CHF by report. He is still able to wear normal shoes, only mild ankle puffiness. He will keep [place] informed if symptoms do not improve. He thanked me for call. Evin Chirco PA-C

## 2019-04-03 NOTE — Telephone Encounter (Signed)
Anderson Malta, can we get Mr. Scott Schwartz set up for a virtual OV this week? Given the severity of his reported swelling as below and history, I think it would be beneficial to have a formal discussion with a provider to discuss symptoms and a plan going forward to follow BP and labs. I think he would benefit from sending [facility] out to get a BP and labs but this is much easier accomplished when there is an encounter to relay appropriate identified concerns.   Prudence Heiny PA-C

## 2019-04-03 NOTE — Telephone Encounter (Signed)
Call placed to pt re: mychart message below. Left a message for pt to call the office back.

## 2019-04-04 ENCOUNTER — Encounter: Payer: Self-pay | Admitting: Cardiology

## 2019-04-04 ENCOUNTER — Telehealth: Payer: Self-pay

## 2019-04-04 ENCOUNTER — Encounter: Payer: Self-pay | Admitting: *Deleted

## 2019-04-04 ENCOUNTER — Other Ambulatory Visit: Payer: Self-pay

## 2019-04-04 ENCOUNTER — Telehealth (INDEPENDENT_AMBULATORY_CARE_PROVIDER_SITE_OTHER): Payer: Federal, State, Local not specified - PPO | Admitting: Cardiology

## 2019-04-04 VITALS — Ht 66.0 in | Wt 174.0 lb

## 2019-04-04 DIAGNOSIS — I255 Ischemic cardiomyopathy: Secondary | ICD-10-CM

## 2019-04-04 DIAGNOSIS — I10 Essential (primary) hypertension: Secondary | ICD-10-CM | POA: Diagnosis not present

## 2019-04-04 DIAGNOSIS — R600 Localized edema: Secondary | ICD-10-CM

## 2019-04-04 DIAGNOSIS — I2510 Atherosclerotic heart disease of native coronary artery without angina pectoris: Secondary | ICD-10-CM

## 2019-04-04 MED ORDER — POTASSIUM CHLORIDE CRYS ER 20 MEQ PO TBCR: 20.0000 meq | EXTENDED_RELEASE_TABLET | Freq: Every day | ORAL | 3 refills | Status: DC

## 2019-04-04 MED ORDER — HYDROCHLOROTHIAZIDE 12.5 MG PO CAPS: 12.5000 mg | ORAL_CAPSULE | Freq: Every day | ORAL | 3 refills | Status: DC

## 2019-04-04 NOTE — Patient Instructions (Addendum)
Medication Instructions:  Your physician has recommended you make the following change in your medication:  1. TAKE HYDROCHLOROTHIAZIDE 12.5 MG DAILY. 2. START POTASSIUM 20 MEQ DAILY.   If you need a refill on your cardiac medications before your next appointment, please call your pharmacy.   Lab work: TO BE DONE THIS WEEK: BMET  If you have labs (blood work) drawn today and your tests are completely normal, you will receive your results only by: Marland Kitchen. MyChart Message (if you have MyChart) OR . A paper copy in the mail If you have any lab test that is abnormal or we need to change your treatment, we will call you to review the results.  Testing/Procedures: Your physician has requested that you have an echocardiogram. Echocardiography is a painless test that uses sound waves to create images of your heart. It provides your doctor with information about the size and shape of your heart and how well your heart's chambers and valves are working. This procedure takes approximately one hour. There are no restrictions for this procedure.  Follow-Up: Your physician recommends that you schedule a follow-up appointment on 04/17/19 with Scott ChardJill McDaniel, NP at 1:30 p.m.   Echocardiogram An echocardiogram is a procedure that uses painless sound waves (ultrasound) to produce an image of the heart. Images from an echocardiogram can provide important information about:  Signs of coronary artery disease (CAD).  Aneurysm detection. An aneurysm is a weak or damaged part of an artery wall that bulges out from the normal force of blood pumping through the body.  Heart size and shape. Changes in the size or shape of the heart can be associated with certain conditions, including heart failure, aneurysm, and CAD.  Heart muscle function.  Heart valve function.  Signs of a past heart attack.  Fluid buildup around the heart.  Thickening of the heart muscle.  A tumor or infectious growth around the heart  valves. Tell a health care provider about:  Any allergies you have.  All medicines you are taking, including vitamins, herbs, eye drops, creams, and over-the-counter medicines.  Any blood disorders you have.  Any surgeries you have had.  Any medical conditions you have.  Whether you are pregnant or may be pregnant. What are the risks? Generally, this is a safe procedure. However, problems may occur, including:  Allergic reaction to dye (contrast) that may be used during the procedure. What happens before the procedure? No specific preparation is needed. You may eat and drink normally. What happens during the procedure?   An IV tube may be inserted into one of your veins.  You may receive contrast through this tube. A contrast is an injection that improves the quality of the pictures from your heart.  A gel will be applied to your chest.  A wand-like tool (transducer) will be moved over your chest. The gel will help to transmit the sound waves from the transducer.  The sound waves will harmlessly bounce off of your heart to allow the heart images to be captured in real-time motion. The images will be recorded on a computer. The procedure may vary among health care providers and hospitals. What happens after the procedure?  You may return to your normal, everyday life, including diet, activities, and medicines, unless your health care provider tells you not to do that. Summary  An echocardiogram is a procedure that uses painless sound waves (ultrasound) to produce an image of the heart.  Images from an echocardiogram can provide important information about  the size and shape of your heart, heart muscle function, heart valve function, and fluid buildup around your heart.  You do not need to do anything to prepare before this procedure. You may eat and drink normally.  After the echocardiogram is completed, you may return to your normal, everyday life, unless your health care  provider tells you not to do that. This information is not intended to replace advice given to you by your health care provider. Make sure you discuss any questions you have with your health care provider. Document Released: 10/02/2000 Document Revised: 11/07/2016 Document Reviewed: 11/07/2016 Elsevier Interactive Patient Education  2019 [employer].

## 2019-04-04 NOTE — Progress Notes (Signed)
Virtual Visit via Video Note   This visit type was conducted due to national recommendations for restrictions regarding the COVID-19 Pandemic (e.g. social distancing) in an effort to limit this patient's exposure and mitigate transmission in our community.  Due to his co-morbid illnesses, this patient is at least at moderate risk for complications without adequate follow up.  This format is felt to be most appropriate for this patient at this time.  All issues noted in this document were discussed and addressed.  A limited physical exam was performed with this format.  Please refer to the patient's chart for his consent to telehealth for [facility].   Date:  04/04/2019   ID:  Scott Schwartz, DOB 12/15/1950, MRN [id number]  Patient Location: Home Provider Location: Home  PCP:  Jarome MatinPaterson, Daniel, MD  Cardiologist:  Armanda Magicraci Turner, MD   Evaluation Performed:  Follow-Up Visit  Chief Complaint: LE swelling, seen for Dr. Mayford Knifeurner  History of Present Illness:    Scott Schwartz is a 68 y.o. male with history of CAD and remote anterior STEMI in 2001 s/p cutting balloon angioplasty of the mid LAD and PTCA of ostial diagonal,OSA on CPAP (followed by neurology), GERD, HTN, dyslipidemia, asthma, bradycardia, claustrophobia, ED, nephrolithasis.  Cardiac history appears to be limited on file. He apparently had a discharge summary from 2001 that outlines his anatomy, with 75% LAD and mid after diagonal 1, and a 75% ostial diagonal 1. He underwent cutting balloon PTCA of his LAD, reduced from 75% to 10% and balloon PTCA of his ostial diagonal. EF at time of cath was reported to be 40%, with normalization by echo afterwards. There are no records in Epic for review. He has not required repeat testing since this time.  He has been referred to the lipid clinic to discuss Zetia and PCSK9's, last seen 04/2018 however did not follow-up as recommended.  He was last seen 10/05/2018 and was feeling well from a  cardiac standpoint.  He reported that his BP had periodic increases in which his PCP had increased his losartan from 100 mg daily to 100 mg twice daily however he had not routinely been taken this twice daily.  He also reported compliance with his CPAP and generally felt better after use.  Last weight, 174lb.   Per chart review, he has had several telephone encounters with complaints of LE swelling and weight gain.  Initially on 02/16/2019 he called in with complaints of elevated BP and weight changes. In discussion, his weight had increased from 174-179lbs however also admitted to eating ice cream nightly and being less active since COVID pandemic. He had no other signs of acute CHF exacerbation, and was able to wear normal shoes with only mild ankle puffiness therefore, no changes were made. On 04/03/2019 he sent a MyChart message stating that for approximately 3 to 4 weeks he has noticed considerable ankle swelling in which he has stopped taking his amlodipine and reduced his salt intake as much as possible with much improvement.  On the day of the call, 04/03/2019 the ankle and feet swelling had decreased, appearing back to his baseline and the patient reported that he thought amlodipine was the culprit.    Today, he reports that his bilateral ankle and feet swelling is back to his baseline after taking himself off of his amlodipine 10 mg. He continues to watch his sodium intake as well. He reports feeling much better. He denies chest pain, shortness of breath, PND, orthopnea, weight gain or other  HF symptoms. He does not have a home BP cuff to monitor his BP after stopping amlodipine.  Last in office BP 150/80 05/30/2018.  [facility] other [facility] to obtain an upper arm cuff and monitoring his BP at least daily, 1 hour after medication and sitting quietly for at least 15 minutes.  He also reports that he has been taking his as needed HCTZ 12.5 mg on a daily basis which I would have  recommended. We discussed that he will need lab work to monitor his electrolytes and would need K+ supplementation.  He is asking about a repeat echocardiogram which I feel is an appropriate request given how long it has been since previous assessment.  His weight is been stable at 174 pounds with no significant fluctuations.     The patient does not have symptoms concerning for COVID-19 infection (fever, chills, cough, or new shortness of breath).   Past Medical History:  Diagnosis Date  . Asthma   . Bradycardia 07/19/2015  . CAD (coronary artery disease) 2001   AD and remote anterior STEMI in 2001 s/p cutting balloon angioplasty of the mid LAD and PTCA of ostial diagonal  . Claustrophobia 09/22/2013  . Diabetes mellitus (HCC)   . ED (erectile dysfunction)   . GERD (gastroesophageal reflux disease)   . Glaucoma    Borderline  . History of colonoscopy 02/17/2003  . Hyperlipidemia   . Hypertension   . Ischemic cardiomyopathy    a. EF 40% at time of MI 2001, then normal by f/u echo at that time.  . Nephrolithiasis   . OSA (obstructive sleep apnea) 09/22/2013   Past Surgical History:  Procedure Laterality Date  . CARDIAC CATHETERIZATION    . COLONOSCOPY  02/2003  . VASECTOMY       Current Meds  Medication Sig  . albuterol (PROVENTIL HFA;VENTOLIN HFA) 108 (90 Base) MCG/ACT inhaler Inhale 2 puffs into the lungs every 6 (six) hours as needed for wheezing or shortness of breath.  . ALPRAZolam (XANAX) 0.25 MG tablet Take 0.25 mg by mouth at bedtime as needed for anxiety.  Marland Kitchen. aspirin 81 MG chewable tablet Chew 81 mg by mouth daily.  Marland Kitchen. ezetimibe (ZETIA) 10 MG tablet Take 1 tablet (10 mg total) by mouth daily.  . fluticasone (FLONASE) 50 MCG/ACT nasal spray Place into both nostrils as needed. Use as directed  . folic acid (FOLVITE) 1 MG tablet Take 1 mg by mouth daily.  Marland Kitchen. GARCINIA CAMBOGIA-CHROMIUM PO Take 1 capsule by mouth daily.  Boris Lown. Krill Oil 350 MG CAPS Take 1 capsule by mouth daily.  Marland Kitchen.  losartan (COZAAR) 100 MG tablet Take 1 tablet (100 mg total) by mouth daily.  . metoprolol succinate (TOPROL-XL) 25 MG 24 hr tablet Take 0.5 tablets (12.5 mg total) by mouth daily.  . Misc Natural Products (TART CHERRY ADVANCED PO) Take 1 capsule by mouth daily.  . montelukast (SINGULAIR) 10 MG tablet Take 10 mg by mouth at bedtime.  . Multiple Vitamin (MULTIVITAMIN) tablet Take 1 tablet by mouth daily.  . nitroGLYCERIN (NITROLINGUAL) 0.4 MG/SPRAY spray Place 1 spray under the tongue every 5 (five) minutes x 3 doses as needed for chest pain.  Marland Kitchen. omeprazole (PRILOSEC) 20 MG capsule Take 20 mg by mouth daily.  . sildenafil (VIAGRA) 100 MG tablet Take 100 mg by mouth daily as needed for erectile dysfunction.  . [DISCONTINUED] hydrochlorothiazide (HYDRODIURIL) 12.5 MG tablet Take 1 tablet (12.5 mg total) by mouth daily as needed (For  swelling).     Allergies:   Ivp dye [iodinated diagnostic agents]   Social History   Tobacco Use  . Smoking status: Former Smoker    Years: 7.00    Types: Cigarettes  . Smokeless tobacco: Never Used  . Tobacco comment: Pt Quit 1970's  Substance Use Topics  . Alcohol use: No  . Drug use: No     Family Hx: The patient's family history includes CAD in his brother; CAD (age of onset: 3556) in his father; Leukemia (age of onset: 5877) in his mother.  ROS:   Please see the history of present illness.     All other systems reviewed and are negative.  Prior CV studies:   The following studies were reviewed today:  Unable to locate records within epic system  Labs/Other Tests and Data Reviewed:    EKG:  An ECG dated 10/05/2018 was personally reviewed today and demonstrated:  NSR with TWI in inferior and anterior leads, no acute changes   Recent Labs: 11/17/2018: ALT 19; BUN 17; Creatinine, Ser 1.15; Potassium 4.2; Sodium 139   Recent Lipid Panel Lab Results  Component Value Date/Time   CHOL 107 11/17/2018 08:08 AM   TRIG 121 11/17/2018 08:08 AM   HDL 36  (L) 11/17/2018 08:08 AM   CHOLHDL 3.0 11/17/2018 08:08 AM   CHOLHDL 4.7 07/16/2016 11:19 AM   LDLCALC 47 11/17/2018 08:08 AM    Wt Readings from Last 3 Encounters:  04/04/19 174 lb (78.9 kg)  11/21/18 174 lb (78.9 kg)  10/24/18 174 lb (78.9 kg)     Objective:    Vital Signs:  Ht 5\' 6"  (1.676 m)   Wt 174 lb (78.9 kg)   BMI 28.08 kg/m    VITAL SIGNS:  reviewed GEN:  no acute distress EYES:  sclerae anicteric, EOMI - Extraocular Movements Intact RESPIRATORY:  normal respiratory effort, symmetric expansion CARDIOVASCULAR:  no peripheral edema SKIN:  no rash, lesions or ulcers. NEURO:  alert and oriented x 3, no obvious focal deficit PSYCH:  normal affect  ASSESSMENT & PLAN:     1.  BLE swelling/ ankle/foot: -Patient reports that for the last 3 to 4 weeks he has noticed an increase in his LE swelling, bilateral ankle and foot.  -Patient self discontinued amlodipine 10 mg given the known side effect of swelling and has had much improvement in his symptoms.  Additionally, has been monitoring his sodium intake.  Reports ankle and foot size is back to his baseline, no weight gain. -Deniesshortness of breath, orthopnea, PND -Low suspicion for HF however we discussed repeat echocardiogram given the significant length of time since his last assessment.  Reports of last assessment with normalized LVEF -Will obtain repeat echocardiogram and follow-up from there -Add HCTZ 12.5 mg p.o. daily to regimen as well as K+ supplementation -Will follow with BMET this week -Given the discontinuation of amlodipine, patient is to obtain BP cuff and monitor closely -I will follow with him in 1 to 2 weeks for reassessment and medication adjustment as needed  2. CAD s/p PCI/DES of LAD 2001: -Denies anginal symptoms  -Continues to be active without complication, eager to get back to exercising after COVID restrictions -Continue ASA, losartan, metoprolol  3. Essential hypertension: -Unable to assess  most current reading given no patient access to BP cuff -Continue losartan 100 mg daily -Recommended obtaining upper arm BP cuff for easier monitoring.  He is to record his BP readings for the next 1 to 2 weeks, 1  hour after medication administration and 15 minutes after sitting quietly.  I will follow with him in 1 to 2 weeks for reassessment  -If BP continues to be elevated, may need to increase HCTZ to 25 mg daily -Reports improvement in LE swelling after stopping amlodipine 10 mg  4.  Ischemic cardiomyopathy: -Initial LV function decreased after MI in 2001 with reports of normalized LVEF with repeat echocardiogram 2012 however this is been difficult to find within our charting system -Given the significant length of time since last assessment, will obtain an echocardiogram prior records for completion -Denies shortness of breath, weight gain, orthopnea symptoms, PND   COVID-19 Education: The signs and symptoms of COVID-19 were discussed with the patient and how to seek care for testing (follow up with PCP or arrange E-visit). The importance of social distancing was discussed today.  Time:   Today, I have spent 25 minutes with the patient with telehealth technology discussing the above problems.     Medication Adjustments/Labs and Tests Ordered: Current medicines are reviewed at length with the patient today.  Concerns regarding medicines are outlined above.   Tests Ordered: Orders Placed This Encounter  Procedures  . Basic metabolic panel  . ECHOCARDIOGRAM COMPLETE    Medication Changes: Meds ordered this encounter  Medications  . hydrochlorothiazide (MICROZIDE) 12.5 MG capsule    Sig: Take 1 capsule (12.5 mg total) by mouth daily.    Dispense:  90 capsule    Refill:  3  . potassium chloride SA (K-DUR) 20 MEQ tablet    Sig: Take 1 tablet (20 mEq total) by mouth daily.    Dispense:  90 tablet    Refill:  3    Follow Up:  Virtual Visit Kathyrn Drown in 1 to 2 weeks   Signed, Kathyrn Drown, NP  04/04/2019 1:58 PM    [facility]

## 2019-04-04 NOTE — Telephone Encounter (Signed)

## 2019-04-06 ENCOUNTER — Other Ambulatory Visit: Payer: Self-pay

## 2019-04-06 ENCOUNTER — Other Ambulatory Visit: Payer: Federal, State, Local not specified - PPO | Admitting: *Deleted

## 2019-04-06 DIAGNOSIS — R600 Localized edema: Secondary | ICD-10-CM

## 2019-04-06 DIAGNOSIS — I2510 Atherosclerotic heart disease of native coronary artery without angina pectoris: Secondary | ICD-10-CM

## 2019-04-06 DIAGNOSIS — I10 Essential (primary) hypertension: Secondary | ICD-10-CM

## 2019-04-06 DIAGNOSIS — I255 Ischemic cardiomyopathy: Secondary | ICD-10-CM

## 2019-04-06 LAB — BASIC METABOLIC PANEL
BUN/Creatinine Ratio: 13 (ref 10–24)
BUN: 17 mg/dL (ref 8–27)
CO2: 25 mmol/L (ref 20–29)
Calcium: 9.5 mg/dL (ref 8.6–10.2)
Chloride: 102 mmol/L (ref 96–106)
Creatinine, Ser: 1.36 mg/dL — ABNORMAL HIGH (ref 0.76–1.27)
GFR calc Af Amer: 62 mL/min/{1.73_m2} (ref >59)
GFR calc non Af Amer: 54 mL/min/{1.73_m2} — ABNORMAL LOW (ref >59)
Glucose: 147 mg/dL — ABNORMAL HIGH (ref 65–99)
Potassium: 4.1 mmol/L (ref 3.5–5.2)
Sodium: 141 mmol/L (ref 134–144)

## 2019-04-07 ENCOUNTER — Encounter (HOSPITAL_COMMUNITY): Payer: Self-pay | Admitting: Cardiology

## 2019-04-10 NOTE — Progress Notes (Signed)
Cardiology Office Note:  Date:  04/17/2019   ID:  Scott Schwartz, DOB 1951/06/28, MRN [id number]  Cardiology Office Visit:   PCP:  Leanna Battles, MD  Cardiologist:  Fransico Him, MD   Chief Complaint:  Follow up HTN, seen for Dr. Radford Pax   History of Present Illness:    Scott Schwartz is a 68 y.o. male with historyofCAD and remote anterior STEMI in 2001 s/p cutting balloon angioplasty of the mid LAD and PTCA of ostial diagonal,OSA on CPAP, GERD, HTN, dyslipidemia, asthma, bradycardia, claustrophobia, ED, nephrolithasis.  Cardiac history appears to be limited on file. He apparently had a discharge summary from 2001 that outlines his anatomy, with 75% LAD and mid after diagonal 1, and a 75% ostial diagonal 1. He underwent cutting balloon PTCA of his LAD, reduced from 75% to 10% and balloon PTCA of his ostial diagonal. EF at time of cath was reported to be 40%, with normalization by echo afterwards. There are no records in Epic for review. He has not required repeat testing since this time.  He has been referred to the lipid clinic to discuss Zetia and PCSK9's, last seen 04/2018 however did not follow-up as recommended.  He was seen 10/05/2018 and was feeling well from a cardiac standpoint.  He reported that his BP had periodic increases in which his PCP had increased his losartan from 100 mg daily to 100 mg twice daily however he had not routinely been taken this twice daily. He also reported compliance with his CPAP and generally felt better after use.  Last weight, 174lb.   Per chart review, he has had several telephone encounters with complaints of LE swelling and weight gain.  Initially on 02/16/2019 he called in with complaints of elevated BP and weight changes. In discussion, his weight had increased from 174-179lbs however also admitted to eating ice cream nightly and being less active since COVID pandemic. He had no other signs of acute CHF exacerbation, and was able to wear  normal shoes with only mild ankle puffiness therefore, no changes were made. On 04/03/2019 he sent a MyChart message stating that for approximately 3 to 4 weeks he has noticed considerable ankle swelling in which he has stopped taking his amlodipine and reduced his salt intake as much as possible with much improvement.   He was last seen by myself on 04/04/2019 in follow-up and reported that his bilateral ankle and feet swelling was back to his baseline after taking himself off of his amlodipine 10 mg. He continued to watch his sodium intake as well. He reported feeling much better. He denies chest pain, shortness of breath, PND, orthopnea, weight gain or other HF symptoms. He did not have a home BP cuff to monitor his BP after stopping amlodipine. Last in office BP 150/80 05/30/2018.  We discussed going to [facility] or other drugstore to obtain an upper arm cuff and monitoring his BP at least daily, 1 hour after medication and sitting quietly for at least 15 minutes.  He also reported that he had been taking his as needed HCTZ 12.5 mg on a daily basis which I would have recommended. We discussed that he will need lab work to monitor his electrolytes and would need K+ supplementation.  He was asking about a repeat echocardiogram which I feel is an appropriate request given how long it has been since previous assessment.  His weight was noted to have been stable at 174lbs with no significant fluctuations.    Today he  comes for BP follow up and is doing better.  Continues to deny lower extremity swelling.  Reports BP is likely a little up as he almost forgot about his appointment today and rushed over here.  Denies chest pain, shortness of breath, PND, orthopnea, dizziness or syncope.  Has been compliant with medications including HCTZ and potassium supplementation.  Has a follow-up with his PCP later today in which he will get labs.  Will follow creatinine and potassium at that time.  Does report some fatigue however  works part-time as a Education officer, environmentalpastor and had a long discussion as he truly feels this is related to increased stressed with COVID-19.  We discussed options for stress relief such as exercise to give him a little reprieve.    Past Medical History:  Diagnosis Date  . Asthma   . Bradycardia 07/19/2015  . CAD (coronary artery disease) 2001   AD and remote anterior STEMI in 2001 s/p cutting balloon angioplasty of the mid LAD and PTCA of ostial diagonal  . Claustrophobia 09/22/2013  . Diabetes mellitus (HCC)   . ED (erectile dysfunction)   . GERD (gastroesophageal reflux disease)   . Glaucoma    Borderline  . History of colonoscopy 02/17/2003  . Hyperlipidemia   . Hypertension   . Ischemic cardiomyopathy    a. EF 40% at time of MI 2001, then normal by f/u echo at that time.  . Nephrolithiasis   . OSA (obstructive sleep apnea) 09/22/2013   Past Surgical History:  Procedure Laterality Date  . CARDIAC CATHETERIZATION    . COLONOSCOPY  02/2003  . VASECTOMY       Current Meds  Medication Sig  . albuterol (PROVENTIL HFA;VENTOLIN HFA) 108 (90 Base) MCG/ACT inhaler Inhale 2 puffs into the lungs every 6 (six) hours as needed for wheezing or shortness of breath.  . ALPRAZolam (XANAX) 0.25 MG tablet Take 0.25 mg by mouth at bedtime as needed for anxiety.  Marland Kitchen. aspirin 81 MG chewable tablet Chew 81 mg by mouth daily.  . fluticasone (FLONASE) 50 MCG/ACT nasal spray Place into both nostrils as needed. Use as directed  . folic acid (FOLVITE) 1 MG tablet Take 1 mg by mouth daily.  Marland Kitchen. GARCINIA CAMBOGIA-CHROMIUM PO Take 1 capsule by mouth daily.  . hydrochlorothiazide (MICROZIDE) 12.5 MG capsule Take 1 capsule (12.5 mg total) by mouth daily.  Boris Lown. Krill Oil 350 MG CAPS Take 1 capsule by mouth daily.  Marland Kitchen. losartan (COZAAR) 100 MG tablet Take 1 tablet (100 mg total) by mouth daily.  . metoprolol succinate (TOPROL-XL) 25 MG 24 hr tablet Take 0.5 tablets (12.5 mg total) by mouth daily.  . Misc Natural Products (TART CHERRY  ADVANCED PO) Take 1 capsule by mouth daily.  . montelukast (SINGULAIR) 10 MG tablet Take 10 mg by mouth at bedtime.  . Multiple Vitamin (MULTIVITAMIN) tablet Take 1 tablet by mouth daily.  . nitroGLYCERIN (NITROLINGUAL) 0.4 MG/SPRAY spray Place 1 spray under the tongue every 5 (five) minutes x 3 doses as needed for chest pain.  Marland Kitchen. omeprazole (PRILOSEC) 20 MG capsule Take 20 mg by mouth daily.  . potassium chloride SA (K-DUR) 20 MEQ tablet Take 1 tablet (20 mEq total) by mouth daily.  . sildenafil (VIAGRA) 100 MG tablet Take 100 mg by mouth daily as needed for erectile dysfunction.     Allergies:   Ivp dye [iodinated diagnostic agents]   Social History   Tobacco Use  . Smoking status: Former Smoker    Years: 7.00  Types: Cigarettes  . Smokeless tobacco: Never Used  . Tobacco comment: Pt Quit 1970's  Substance Use Topics  . Alcohol use: No  . Drug use: No     Family Hx: The patient's family history includes CAD in his brother; CAD (age of onset: 7856) in his father; Leukemia (age of onset: 277) in his mother.  ROS:   Please see the history of present illness.     All other systems reviewed and are negative.  Prior CV studies:   The following studies were reviewed today:  Unable to find records in epic  Labs/Other Tests and Data Reviewed:    EKG:  No ECG reviewed.  Recent Labs: 11/17/2018: ALT 19 04/06/2019: BUN 17; Creatinine, Ser 1.36; Potassium 4.1; Sodium 141   Recent Lipid Panel Lab Results  Component Value Date/Time   CHOL 107 11/17/2018 08:08 AM   TRIG 121 11/17/2018 08:08 AM   HDL 36 (L) 11/17/2018 08:08 AM   CHOLHDL 3.0 11/17/2018 08:08 AM   CHOLHDL 4.7 07/16/2016 11:19 AM   LDLCALC 47 11/17/2018 08:08 AM    Wt Readings from Last 3 Encounters:  04/17/19 177 lb (80.3 kg)  04/04/19 174 lb (78.9 kg)  11/21/18 174 lb (78.9 kg)     Objective:    Vital Signs:  BP (!) 142/80   Pulse (!) 54   Ht 5\' 6"  (1.676 m)   Wt 177 lb (80.3 kg)   BMI 28.57 kg/m     General: Well developed, well nourished, NAD Neck: Negative for carotid bruits. No JVD Lungs:Clear to ausculation bilaterally. No wheezes, rales, or rhonchi. Breathing is unlabored. Cardiovascular: RRR with S1 S2. No murmurs, rubs, gallops, or LV heave appreciated. Extremities: No edema. No clubbing or cyanosis. DP/PT pulses 2+ bilaterally Neuro: Alert and oriented. No focal deficits. No facial asymmetry. MAE spontaneously. Psych: Responds to questions appropriately with normal affect.    ASSESSMENT & PLAN:    1.  BLE swelling/ ankle/foot: -Stable off amlodipine  -Continue HCTZ 12.5mg  daily  -Denies shortness of breath, orthopnea, PND -Low suspicion for HF however we discussed repeat echocardiogram given the significant length of time since his last assessment>>echo performed 04/14/2019 with normal EF and impaired relaxation  -Will obtain repeat echocardiogram and follow-up from there -Has follow up with PCP later today which he will receive lab work >>creatinine was elevated at last lab draw at 1.36  2. CAD s/p PCI/DES of LAD 2001: -Denies anginal symptoms  -Continues to be active without complication -Continue ASA, losartan, metoprolol  3. Essential hypertension: -Continue losartan 100 mg daily -Still needs to get cuff>>discussed proper assessment >>1 hour after medication administration and 15 minutes after sitting quietly.  -Reports improvement in LE swelling after stopping amlodipine 10 mg  4.  Ischemic cardiomyopathy: -Initial LV function decreased after MI in 2001 with reports of normalized LVEF with repeat echocardiogram 2012 however this is been difficult to find within our charting system -Repeat echo from 03/2019 with normal EF and diastolic dysfunction  -Denies shortness of breath, weight gain, orthopnea symptoms, PND    COVID-19 Education: The signs and symptoms of COVID-19 were discussed with the patient and how to seek care for testing (follow up with PCP or  arrange E-visit).  The importance of social distancing was discussed today.  Time:   Today, I have spent 20 minutes with the patient with telehealth technology discussing the above problems.     Medication Adjustments/Labs and Tests Ordered: Current medicines are reviewed at length with the  patient today.  Concerns regarding medicines are outlined above.   Tests Ordered: No orders of the defined types were placed in this encounter.   Medication Changes: No orders of the defined types were placed in this encounter.   Follow Up:  In Person Dr. Mayford Knifeurner in 6 months   Signed, Georgie ChardJill Ahria Slappey, NP  04/17/2019 2:01 PM    [facility]

## 2019-04-11 ENCOUNTER — Telehealth: Payer: Self-pay | Admitting: Cardiology

## 2019-04-11 DIAGNOSIS — I2510 Atherosclerotic heart disease of native coronary artery without angina pectoris: Secondary | ICD-10-CM

## 2019-04-11 DIAGNOSIS — I10 Essential (primary) hypertension: Secondary | ICD-10-CM

## 2019-04-11 DIAGNOSIS — R7989 Other specified abnormal findings of blood chemistry: Secondary | ICD-10-CM

## 2019-04-11 NOTE — Telephone Encounter (Signed)
New Message      Pt is calling for Lab results    Please call back

## 2019-04-11 NOTE — Telephone Encounter (Signed)
Notes recorded by Tommie Raymond, NP on 04/07/2019 at 8:31 AM EDT  Please call Mr. Delconte and let him know that his kidney function is slightly higher than his baseline. I would like for him to make sure that he is drinking at least 6-8 glasses of water per day and lets re-check this lab in 1-2 weeks to assess for improvement.  Spoke with the pt and informed him of his lab results and recommendations per Kathyrn Drown NP for him to increase his po water intake to drinking 6-8 glasses of water a day, and come back in for a repeat BMET in 2 weeks.  Scheduled the pt a lab appt for repeat BMET in 2 weeks on 04/25/19 at 0815.   Pt verbalized understanding and agrees with this plan.

## 2019-04-12 ENCOUNTER — Telehealth: Payer: Self-pay | Admitting: *Deleted

## 2019-04-12 NOTE — Telephone Encounter (Signed)
Call placed to pt re: appt 04/17/2019.  Need to change to inoffice and do covid19 prescreen questions. Left pt a message to call back.

## 2019-04-12 NOTE — Telephone Encounter (Signed)
Pt returned my call and he has agreed to an in-office visit. Pt answered no to the following covid19 prescreen questions below.        COVID-19 Pre-Screening Questions:  . In the past 7 to 10 days have you had a cough,  shortness of breath, headache, congestion, fever (100 or greater) body aches, chills, sore throat, or sudden loss of taste or sense of smell? . Have you been around anyone with known Covid 19. . Have you been around anyone who is awaiting Covid 19 test results in the past 7 to 10 days? . Have you been around anyone who has been exposed to Covid 19, or has mentioned symptoms of Covid 19 within the past 7 to 10 days?  If you have any concerns/questions about symptoms patients report during screening (either on the phone or at threshold). Contact the provider seeing the patient or DOD for further guidance.  If neither are available contact a member of the leadership team.

## 2019-04-12 NOTE — Telephone Encounter (Signed)
Patient returned your call.

## 2019-04-13 ENCOUNTER — Telehealth (HOSPITAL_COMMUNITY): Payer: Self-pay | Admitting: Cardiology

## 2019-04-13 NOTE — Telephone Encounter (Signed)

## 2019-04-14 ENCOUNTER — Other Ambulatory Visit: Payer: Self-pay

## 2019-04-14 ENCOUNTER — Ambulatory Visit (HOSPITAL_COMMUNITY): Payer: Federal, State, Local not specified - PPO | Attending: Internal Medicine

## 2019-04-14 DIAGNOSIS — I255 Ischemic cardiomyopathy: Secondary | ICD-10-CM | POA: Insufficient documentation

## 2019-04-17 ENCOUNTER — Other Ambulatory Visit: Payer: Self-pay

## 2019-04-17 ENCOUNTER — Encounter: Payer: Self-pay | Admitting: Cardiology

## 2019-04-17 ENCOUNTER — Ambulatory Visit: Payer: Federal, State, Local not specified - PPO | Admitting: Cardiology

## 2019-04-17 VITALS — BP 142/80 | HR 54 | Ht 66.0 in | Wt 177.0 lb

## 2019-04-17 DIAGNOSIS — I2510 Atherosclerotic heart disease of native coronary artery without angina pectoris: Secondary | ICD-10-CM

## 2019-04-17 DIAGNOSIS — R600 Localized edema: Secondary | ICD-10-CM | POA: Diagnosis not present

## 2019-04-17 DIAGNOSIS — R001 Bradycardia, unspecified: Secondary | ICD-10-CM

## 2019-04-17 DIAGNOSIS — I10 Essential (primary) hypertension: Secondary | ICD-10-CM

## 2019-04-17 DIAGNOSIS — R7989 Other specified abnormal findings of blood chemistry: Secondary | ICD-10-CM

## 2019-04-17 NOTE — Patient Instructions (Signed)
Medication Instructions:  Your physician recommends that you continue on your current medications as directed. Please refer to the Current Medication list given to you today.  If you need a refill on your cardiac medications before your next appointment, please call your pharmacy.   Lab work: None ordered  If you have labs (blood work) drawn today and your tests are completely normal, you will receive your results only by: . MyChart Message (if you have MyChart) OR . A paper copy in the mail If you have any lab test that is abnormal or we need to change your treatment, we will call you to review the results.  Testing/Procedures: None ordered  Follow-Up: At [facility], you and your health needs are our priority.  As part of our continuing mission to provide you with exceptional heart care, we have created designated Provider Care Teams.  These Care Teams include your primary Cardiologist (physician) and Advanced Practice Providers (APPs -  Physician Assistants and Nurse Practitioners) who all work together to provide you with the care you need, when you need it. You will need a follow up appointment in 6 months.  Please call our office 2 months in advance to schedule this appointment.  You may see Traci Turner, MD or one of the following Advanced Practice Providers on your designated Care Team:   Brittainy Simmons, PA-C Dayna Dunn, PA-C . Michele Lenze, PA-C  Any Other Special Instructions Will Be Listed Below (If Applicable).    

## 2019-04-25 ENCOUNTER — Other Ambulatory Visit: Payer: Self-pay

## 2019-04-25 ENCOUNTER — Other Ambulatory Visit: Payer: Federal, State, Local not specified - PPO

## 2019-04-25 DIAGNOSIS — R7989 Other specified abnormal findings of blood chemistry: Secondary | ICD-10-CM

## 2019-04-25 DIAGNOSIS — I2510 Atherosclerotic heart disease of native coronary artery without angina pectoris: Secondary | ICD-10-CM

## 2019-04-25 DIAGNOSIS — I10 Essential (primary) hypertension: Secondary | ICD-10-CM

## 2019-04-25 LAB — BASIC METABOLIC PANEL
BUN/Creatinine Ratio: 14 (ref 10–24)
BUN: 18 mg/dL (ref 8–27)
CO2: 22 mmol/L (ref 20–29)
Calcium: 9.3 mg/dL (ref 8.6–10.2)
Chloride: 104 mmol/L (ref 96–106)
Creatinine, Ser: 1.31 mg/dL — ABNORMAL HIGH (ref 0.76–1.27)
GFR calc Af Amer: 65 mL/min/{1.73_m2} (ref >59)
GFR calc non Af Amer: 56 mL/min/{1.73_m2} — ABNORMAL LOW (ref >59)
Glucose: 118 mg/dL — ABNORMAL HIGH (ref 65–99)
Potassium: 4.6 mmol/L (ref 3.5–5.2)
Sodium: 141 mmol/L (ref 134–144)

## 2019-05-01 ENCOUNTER — Telehealth: Payer: Self-pay

## 2019-05-01 NOTE — Telephone Encounter (Signed)
-----   Message from Scott Raymond, NP sent at 05/01/2019  7:36 AM EDT ----- Please let the patient know that his labs look stable.

## 2019-05-01 NOTE — Telephone Encounter (Signed)
Notes recorded by Frederik Schmidt, RN on 05/01/2019 at 8:20 AM EDT  The patient has been notified of the result and verbalized understanding. All questions (if any) were answered.  Frederik Schmidt, RN 05/01/2019 8:20 AM

## 2019-05-05 ENCOUNTER — Other Ambulatory Visit: Payer: Self-pay | Admitting: Physician Assistant

## 2019-06-07 ENCOUNTER — Encounter: Payer: Self-pay | Admitting: Adult Health

## 2019-06-15 ENCOUNTER — Other Ambulatory Visit: Payer: Self-pay

## 2019-06-15 ENCOUNTER — Ambulatory Visit: Payer: Federal, State, Local not specified - PPO | Admitting: Family Medicine

## 2019-06-15 ENCOUNTER — Encounter: Payer: Self-pay | Admitting: Family Medicine

## 2019-06-15 VITALS — BP 102/80 | HR 53 | Temp 97.7°F | Ht 66.5 in | Wt 179.0 lb

## 2019-06-15 DIAGNOSIS — Z9989 Dependence on other enabling machines and devices: Secondary | ICD-10-CM | POA: Diagnosis not present

## 2019-06-15 DIAGNOSIS — G4733 Obstructive sleep apnea (adult) (pediatric): Secondary | ICD-10-CM | POA: Diagnosis not present

## 2019-06-15 NOTE — Progress Notes (Signed)
PATIENT: Scott Schwartz DOB: Feb 29, 1951  REASON FOR VISIT: follow up HISTORY FROM: patient  Chief Complaint  Patient presents with  . Follow-up    Room 8, alone. OSA/CPAP "doing well, thinks there maybe a leakage"     HISTORY OF PRESENT ILLNESS: Today 06/15/19 Scott Schwartz is a 68 y.o. male here today for follow up for OSA on CPAP. He is doing well and notes benefit of therapy. He continues to have a leak. He has ordered supplies online and does not use DME. He is uncertain why. He has not changed tubing in about 3 years. He changes mask periodically.   Compliance report dated 05/15/2019 through 06/13/2019 reveals that he is using CPAP 27 out of the last 30 days for compliance of 90%.  23 days he used CPAP greater than 4 hours for compliance of 77%.  Average usage was 6 hours and 10 minutes.  AHI was 3.6 on 6 cm of water and an EPR of 3.  There was a significant leak noted in the 95th percentile at 49.2.  HISTORY: (copied from [place] note on 06/16/2018)  Scott Schwartz is a 68 year old male with a history of obstructive sleep apnea on CPAP.  He returns today for follow-up.  His CPAP download indicates that he uses machine 27 out of 30 days for compliance of 90%.  He uses machine greater than 4 hours for 23 days for compliance of 73%.  On average he uses his machine 5 hours and 46 minutes.  His residual AHI is 1.7 on 6 cm of water with EPR 3.  His leak in the 95th percentile is 42.6 L/min.  He does state that he has changed his straps but he does need a new mask.  He also reports that at night he has very vivid violent dreams and he thrashes about in bed.  He reports that his wife has made mention of this several times.  He returns today for evaluation.  HISTORY ScottYourseunderwent a sleep study on 03-24-2012 which confirmed the presence of sleep apnea at an AHI of 14 and an RDI of 26. During REM sleep he did not have accentuated higher number of apneas but in supine sleep his AHI was  33.8. He had minimum desaturations at the time and was titrated to 8 cm water. Best effective pressures seem to be 7 cm with a flex setting of 2 cm water for the PE are which means expiratory pressure relief. We met shortly after the sleep study and the patient switched from a facial mask to a nasal pillow which he has used since. He states that his machine already originated from 2003 when he was for the first time evaluated, diagnosed with OSA and titrated to CPAP. This machine no longer works for him and he is definitely overdue to receive a new one. The patient's past medical history is positive for diabetes mellitus with renal manifestations, type II.  Coronary artery disease with no angina symptoms, hypertension treatment with amlodipine, losartan, propranolol. Nitroglycerin lingual spray as needed. Hyperlipidemia was started on pravastatin 40 mrem tablets, known sleep apnea history diagnosed for the first time 2003 we diagnosed in 2013 . Chief complaint according to patient : Sleep is fragmented, interrupted and no longer restorative. The patient reports that his wife has noted him to snore again loudly and to have irregular breathing.   REVIEW OF SYSTEMS: Out of a complete 14 system review of symptoms, the patient complains only of the following symptoms, apnea,  snoring and all other reviewed systems are negative.  Epworth sleepiness scale: 5 Fatigue severity scale: 29  ALLERGIES: Allergies  Allergen Reactions  . Ivp Dye [Iodinated Diagnostic Agents] Shortness Of Breath    HOME MEDICATIONS: Outpatient Medications Prior to Visit  Medication Sig Dispense Refill  . albuterol (PROVENTIL HFA;VENTOLIN HFA) 108 (90 Base) MCG/ACT inhaler Inhale 2 puffs into the lungs every 6 (six) hours as needed for wheezing or shortness of breath.    . ALPRAZolam (XANAX) 0.25 MG tablet Take 0.25 mg by mouth at bedtime as needed for anxiety.    Marland Kitchen aspirin 81 MG chewable tablet Chew 81 mg by mouth daily.    Marland Kitchen  ezetimibe (ZETIA) 10 MG tablet Take 1 tablet by mouth once daily 30 tablet 5  . fluticasone (FLONASE) 50 MCG/ACT nasal spray Place into both nostrils as needed. Use as directed    . folic acid (FOLVITE) 1 MG tablet Take 1 mg by mouth daily.    Marland Kitchen GARCINIA CAMBOGIA-CHROMIUM PO Take 1 capsule by mouth daily.    Javier Docker Oil 350 MG CAPS Take 1 capsule by mouth daily.    Marland Kitchen latanoprost (XALATAN) 0.005 % ophthalmic solution INSTILL 1 DROP INTO EACH EYE AT BEDTIME    . metoprolol succinate (TOPROL-XL) 25 MG 24 hr tablet Take 0.5 tablets (12.5 mg total) by mouth daily. 15 tablet 11  . Misc Natural Products (TART CHERRY ADVANCED PO) Take 1 capsule by mouth daily.    . montelukast (SINGULAIR) 10 MG tablet Take 10 mg by mouth at bedtime.    . Multiple Vitamin (MULTIVITAMIN) tablet Take 1 tablet by mouth daily.    . nitroGLYCERIN (NITROLINGUAL) 0.4 MG/SPRAY spray Place 1 spray under the tongue every 5 (five) minutes x 3 doses as needed for chest pain.    Marland Kitchen olmesartan-hydrochlorothiazide (BENICAR HCT) 40-12.5 MG tablet     . omeprazole (PRILOSEC) 20 MG capsule Take 20 mg by mouth daily.    . potassium chloride SA (K-DUR) 20 MEQ tablet Take 1 tablet (20 mEq total) by mouth daily. 90 tablet 3  . sildenafil (VIAGRA) 100 MG tablet Take 100 mg by mouth daily as needed for erectile dysfunction.    . hydrochlorothiazide (MICROZIDE) 12.5 MG capsule Take 1 capsule (12.5 mg total) by mouth daily. 90 capsule 3  . losartan (COZAAR) 100 MG tablet Take 1 tablet (100 mg total) by mouth daily. 90 tablet 3   No facility-administered medications prior to visit.     PAST MEDICAL HISTORY: Past Medical History:  Diagnosis Date  . Asthma   . Bradycardia 07/19/2015  . CAD (coronary artery disease) 2001   AD and remote anterior STEMI in 2001 s/p cutting balloon angioplasty of the mid LAD and PTCA of ostial diagonal  . Claustrophobia 09/22/2013  . Diabetes mellitus ([place])   . ED (erectile dysfunction)   . GERD (gastroesophageal  reflux disease)   . Glaucoma    Borderline  . History of colonoscopy 02/17/2003  . Hyperlipidemia   . Hypertension   . Ischemic cardiomyopathy    a. EF 40% at time of MI 2001, then normal by f/u echo at that time.  . Nephrolithiasis   . OSA (obstructive sleep apnea) 09/22/2013    PAST SURGICAL HISTORY: Past Surgical History:  Procedure Laterality Date  . CARDIAC CATHETERIZATION    . COLONOSCOPY  02/2003  . VASECTOMY      FAMILY HISTORY: Family History  Problem Relation Age of Onset  . Leukemia Mother 75  .  CAD Father 6  . CAD Brother     SOCIAL HISTORY: Social History   Socioeconomic History  . Marital status: Single    Spouse name: Not on file  . Number of children: 3  . Years of education: Not on file  . Highest education level: Not on file  Occupational History  . Occupation: RET    Comment: POSTAL  Social Needs  . Financial resource strain: Not on file  . Food insecurity    Worry: Not on file    Inability: Not on file  . Transportation needs    Medical: Not on file    Non-medical: Not on file  Tobacco Use  . Smoking status: Former Smoker    Years: 7.00    Types: Cigarettes  . Smokeless tobacco: Never Used  . Tobacco comment: Pt Quit 1970's  Substance and Sexual Activity  . Alcohol use: No  . Drug use: No  . Sexual activity: Not on file  Lifestyle  . Physical activity    Days per week: Not on file    Minutes per session: Not on file  . Stress: Not on file  Relationships  . Social Herbalist on phone: Not on file    Gets together: Not on file    Attends religious service: Not on file    Active member of club or organization: Not on file    Attends meetings of clubs or organizations: Not on file    Relationship status: Not on file  . Intimate partner violence    Fear of current or ex partner: Not on file    Emotionally abused: Not on file    Physically abused: Not on file    Forced sexual activity: Not on file  Other Topics Concern   . Not on file  Social History Narrative   Pt married 3 children    1 grandchild   Ret [employer]   Former smoker 1 pack x's 36 yrs    Quit in 1970's      PHYSICAL EXAM  Vitals:   06/15/19 1128  BP: 102/80  Pulse: (!) 53  Temp: 97.7 F (36.5 C)  Weight: 179 lb (81.2 kg)  Height: 5' 6.5" (1.689 m)   Body mass index is 28.46 kg/m.  Generalized: Well developed, in no acute distress  Cardiology: normal rate and rhythm, no murmur noted Respiratory: Clear to auscultation bilaterally Neurological examination  Mentation: Alert oriented to time, place, history taking. Follows all commands speech and language fluent Cranial nerve II-XII: Pupils were equal round reactive to light. Extraocular movements were full, visual field were full on confrontational test. Motor: The motor testing reveals 5 over 5 strength of all 4 extremities. Good symmetric motor tone is noted throughout.  Gait and station: Gait is normal.   DIAGNOSTIC DATA (LABS, IMAGING, TESTING) - I reviewed patient records, labs, notes, testing and imaging myself where available.  No flowsheet data found.   Lab Results  Component Value Date   WBC 12.1 (H) 12/16/2008   HGB 16.0 02/28/2011   HCT 47.0 02/28/2011   MCV 90.2 12/16/2008   PLT 201 12/16/2008      Component Value Date/Time   NA 141 04/25/2019 0812   K 4.6 04/25/2019 0812   CL 104 04/25/2019 0812   CO2 22 04/25/2019 0812   GLUCOSE 118 (H) 04/25/2019 0812   GLUCOSE 96 07/01/2015 0754   BUN 18 04/25/2019 0812   CREATININE 1.31 (H) 04/25/2019 0321  CALCIUM 9.3 04/25/2019 0812   PROT 6.5 11/17/2018 0808   ALBUMIN 4.4 11/17/2018 0808   AST 15 11/17/2018 0808   ALT 19 11/17/2018 0808   ALKPHOS 57 11/17/2018 0808   BILITOT 0.5 11/17/2018 0808   GFRNONAA 56 (L) 04/25/2019 0812   GFRAA 65 04/25/2019 0812   Lab Results  Component Value Date   CHOL 107 11/17/2018   HDL 36 (L) 11/17/2018   LDLCALC 47 11/17/2018   TRIG 121 11/17/2018   CHOLHDL 3.0  11/17/2018   No results found for: HGBA1C No results found for: VITAMINB12 No results found for: TSH     ASSESSMENT AND PLAN 68 y.o. year old male  has a past medical history of Asthma, Bradycardia (07/19/2015), CAD (coronary artery disease) (2001), Claustrophobia (09/22/2013), Diabetes mellitus ([place]), ED (erectile dysfunction), GERD (gastroesophageal reflux disease), Glaucoma, History of colonoscopy (02/17/2003), Hyperlipidemia, Hypertension, Ischemic cardiomyopathy, Nephrolithiasis, and OSA (obstructive sleep apnea) (09/22/2013). here with     ICD-10-CM   1. OSA on CPAP  G47.33    Z99.89     Calder is doing very well with CPAP compliance.  Unfortunately, there is a significant leak noted.  I have recommended that he receive CPAP supplies through DME.  I will place order today.  He was encouraged to continue using CPAP therapy nightly and greater than 4 hours each night.  He will follow-up with [place] in 6 months.  He verbalizes understanding and agreement with this plan.   No orders of the defined types were placed in this encounter.    No orders of the defined types were placed in this encounter.     I spent 15 minutes with the patient. 50% of this time was spent counseling and educating patient on plan of care and medications.    Debbora Presto, FNP-C 06/15/2019, 12:29 PM [facility] [address] [place], [place] [address] [phone]

## 2019-06-15 NOTE — Patient Instructions (Signed)
Continue CPAP nightly and for greater than 4 hours each night  Please adjust mask and headgear, replace tubing and reservoir  Follow up in 6 months   CPAP and BPAP Information CPAP and BPAP are methods of helping a person breathe with the use of air pressure. CPAP stands for "continuous positive airway pressure." BPAP stands for "bi-level positive airway pressure." In both methods, air is blown through your nose or mouth and into your air passages to help you breathe well. CPAP and BPAP use different amounts of pressure to blow air. With CPAP, the amount of pressure stays the same while you breathe in and out. With BPAP, the amount of pressure is increased when you breathe in (inhale) so that you can take larger breaths. Your health care provider will recommend whether CPAP or BPAP would be more helpful for you. Why are CPAP and BPAP treatments used? CPAP or BPAP can be helpful if you have:  Sleep apnea.  Chronic obstructive pulmonary disease (COPD).  Heart failure.  Medical conditions that weaken the muscles of the chest including muscular dystrophy, or neurological diseases such as amyotrophic lateral sclerosis (ALS).  Other problems that cause breathing to be weak, abnormal, or difficult. CPAP is most commonly used for obstructive sleep apnea (OSA) to keep the airways from collapsing when the muscles relax during sleep. How is CPAP or BPAP administered? Both CPAP and BPAP are provided by a small machine with a flexible plastic tube that attaches to a plastic mask. You wear the mask. Air is blown through the mask into your nose or mouth. The amount of pressure that is used to blow the air can be adjusted on the machine. Your health care provider will determine the pressure setting that should be used based on your individual needs. When should CPAP or BPAP be used? In most cases, the mask only needs to be worn during sleep. Generally, the mask needs to be worn throughout the night and  during any daytime naps. People with certain medical conditions may also need to wear the mask at other times when they are awake. Follow instructions from your health care provider about when to use the machine. What are some tips for using the mask?   Because the mask needs to be snug, some people feel trapped or closed-in (claustrophobic) when first using the mask. If you feel this way, you may need to get used to the mask. One way to do this is by holding the mask loosely over your nose or mouth and then gradually applying the mask more snugly. You can also gradually increase the amount of time that you use the mask.  Masks are available in various types and sizes. Some fit over your mouth and nose while others fit over just your nose. If your mask does not fit well, talk with your health care provider about getting a different one.  If you are using a mask that fits over your nose and you tend to breathe through your mouth, a chin strap may be applied to help keep your mouth closed.  The CPAP and BPAP machines have alarms that may sound if the mask comes off or develops a leak.  If you have trouble with the mask, it is very important that you talk with your health care provider about finding a way to make the mask easier to tolerate. Do not stop using the mask. Stopping the use of the mask could have a negative impact on your health. What  are some tips for using the machine?  Place your CPAP or BPAP machine on a secure table or stand near an electrical outlet.  Know where the on/off switch is located on the machine.  Follow instructions from your health care provider about how to set the pressure on your machine and when you should use it.  Do not eat or drink while the CPAP or BPAP machine is on. Food or fluids could get pushed into your lungs by the pressure of the CPAP or BPAP.  Do not smoke. Tobacco smoke residue can damage the machine.  For home use, CPAP and BPAP machines can be  rented or purchased through home health care companies. Many different brands of machines are available. Renting a machine before purchasing may help you find out which particular machine works well for you.  Keep the CPAP or BPAP machine and attachments clean. Ask your health care provider for specific instructions. Get help right away if:  You have redness or open areas around your nose or mouth where the mask fits.  You have trouble using the CPAP or BPAP machine.  You cannot tolerate wearing the CPAP or BPAP mask.  You have pain, discomfort, and bloating in your abdomen. Summary  CPAP and BPAP are methods of helping a person breathe with the use of air pressure.  Both CPAP and BPAP are provided by a small machine with a flexible plastic tube that attaches to a plastic mask.  If you have trouble with the mask, it is very important that you talk with your health care provider about finding a way to make the mask easier to tolerate. This information is not intended to replace advice given to you by your health care provider. Make sure you discuss any questions you have with your health care provider. Document Released: 07/03/2004 Document Revised: 01/25/2019 Document Reviewed: 08/24/2016 Elsevier Patient Education  2020 [employer] Inc. Sleep Apnea Sleep apnea affects breathing during sleep. It causes breathing to stop for a short time or to become shallow. It can also increase the risk of:  Heart attack.  Stroke.  Being very overweight (obese).  Diabetes.  Heart failure.  Irregular heartbeat. The goal of treatment is to help you breathe normally again. What are the causes? There are three kinds of sleep apnea:  Obstructive sleep apnea. This is caused by a blocked or collapsed airway.  Central sleep apnea. This happens when the brain does not send the right signals to the muscles that control breathing.  Mixed sleep apnea. This is a combination of obstructive and central  sleep apnea. The most common cause of this condition is a collapsed or blocked airway. This can happen if:  Your throat muscles are too relaxed.  Your tongue and tonsils are too large.  You are overweight.  Your airway is too small. What increases the risk?  Being overweight.  Smoking.  Having a small airway.  Being older.  Being male.  Drinking alcohol.  Taking medicines to calm yourself (sedatives or tranquilizers).  Having family members with the condition. What are the signs or symptoms?  Trouble staying asleep.  Being sleepy or tired during the day.  Getting angry a lot.  Loud snoring.  Headaches in the morning.  Not being able to focus your mind (concentrate).  Forgetting things.  Less interest in sex.  Mood swings.  Personality changes.  Feelings of sadness (depression).  Waking up a lot during the night to pee (urinate).  Dry mouth.  Sore throat. How is this diagnosed?  Your medical history.  A physical exam.  A test that is done when you are sleeping (sleep study). The test is most often done in a sleep lab but may also be done at home. How is this treated?   Sleeping on your side.  Using a medicine to get rid of mucus in your nose (decongestant).  Avoiding the use of alcohol, medicines to help you relax, or certain pain medicines (narcotics).  Losing weight, if needed.  Changing your diet.  Not smoking.  Using a machine to open your airway while you sleep, such as: ? An oral appliance. This is a mouthpiece that shifts your lower jaw forward. ? A CPAP device. This device blows air through a mask when you breathe out (exhale). ? An EPAP device. This has valves that you put in each nostril. ? A BPAP device. This device blows air through a mask when you breathe in (inhale) and breathe out.  Having surgery if other treatments do not work. It is important to get treatment for sleep apnea. Without treatment, it can lead to:   High blood pressure.  Coronary artery disease.  In men, not being able to have an erection (impotence).  Reduced thinking ability. Follow these instructions at home: Lifestyle  Make changes that your doctor recommends.  Eat a healthy diet.  Lose weight if needed.  Avoid alcohol, medicines to help you relax, and some pain medicines.  Do not use any products that contain nicotine or tobacco, such as cigarettes, e-cigarettes, and chewing tobacco. If you need help quitting, ask your doctor. General instructions  Take over-the-counter and prescription medicines only as told by your doctor.  If you were given a machine to use while you sleep, use it only as told by your doctor.  If you are having surgery, make sure to tell your doctor you have sleep apnea. You may need to bring your device with you.  Keep all follow-up visits as told by your doctor. This is important. Contact a doctor if:  The machine that you were given to use during sleep bothers you or does not seem to be working.  You do not get better.  You get worse. Get help right away if:  Your chest hurts.  You have trouble breathing in enough air.  You have an uncomfortable feeling in your back, arms, or stomach.  You have trouble talking.  One side of your body feels weak.  A part of your face is hanging down. These symptoms may be an emergency. Do not wait to see if the symptoms will go away. Get medical help right away. Call your local emergency services (911 in the [place] Do not drive yourself to the hospital. Summary  This condition affects breathing during sleep.  The most common cause is a collapsed or blocked airway.  The goal of treatment is to help you breathe normally while you sleep. This information is not intended to replace advice given to you by your health care provider. Make sure you discuss any questions you have with your health care provider. Document Released: 07/14/2008 Document  Revised: 07/22/2018 Document Reviewed: 05/31/2018 Elsevier Patient Education  2020 [employer].

## 2019-06-29 ENCOUNTER — Ambulatory Visit: Payer: Federal, State, Local not specified - PPO | Admitting: Adult Health

## 2019-10-18 ENCOUNTER — Ambulatory Visit: Payer: Federal, State, Local not specified - PPO | Attending: Internal Medicine

## 2019-10-18 DIAGNOSIS — Z20822 Contact with and (suspected) exposure to covid-19: Secondary | ICD-10-CM

## 2019-10-19 LAB — NOVEL CORONAVIRUS, NAA: SARS-CoV-2, NAA: NOT DETECTED

## 2019-12-13 ENCOUNTER — Other Ambulatory Visit: Payer: Self-pay | Admitting: Physician Assistant

## 2019-12-19 ENCOUNTER — Ambulatory Visit: Payer: Federal, State, Local not specified - PPO | Admitting: Family Medicine

## 2020-05-20 ENCOUNTER — Other Ambulatory Visit: Payer: Self-pay | Admitting: Cardiology

## 2020-06-04 ENCOUNTER — Telehealth: Payer: Self-pay | Admitting: Neurology

## 2020-06-04 NOTE — Telephone Encounter (Signed)
Patient called wanting to know if he is eligible for a new CPAP. Please call patient to discuss.

## 2020-06-04 NOTE — Telephone Encounter (Signed)
thank you for checking on the age of the CPAP. Indeed, we have no recent compliance data, and he would need to use this machine for 30 days as currently is , then we can get supplies and get him a new machine in 4/22.  Send a CC Dr Eloise Harman. He can not get Epic records.

## 2020-06-04 NOTE — Telephone Encounter (Signed)
Called the patient appears the machine he has was started around 01/2016 which would mean April 2022 is when he is due for a new machine. When pulling the download the patient has not really been using this machine since April 2021. Advised that in order to determine compliance and have the insurance cover supplies he would need to begin using the machine > 4 hrs each night. Advised that he also needs a yearly follow up visit. We have set that appointment for little over a month out to allow the pt to be able to start becoming compliant.

## 2020-06-27 ENCOUNTER — Telehealth: Payer: Self-pay | Admitting: Cardiology

## 2020-06-27 MED ORDER — POTASSIUM CHLORIDE CRYS ER 20 MEQ PO TBCR: 20.0000 meq | EXTENDED_RELEASE_TABLET | Freq: Every day | ORAL | 0 refills | Status: DC

## 2020-06-27 NOTE — Telephone Encounter (Signed)
*  STAT* If patient is at the pharmacy, call can be transferred to refill team.   1. Which medications need to be refilled? (please list name of each medication and dose if known) potassium chloride SA (KLOR-CON) 20 MEQ tablet  2. Which pharmacy/location (including street and city if local pharmacy) is medication to be sent to? [facility] [address] [place] - [address]  3. Do they need a 30 day or 90 day supply? 90 day supply   Completely out of medication. Has an appt scheduled with Ronie Spies on 07/02/20.

## 2020-06-27 NOTE — Telephone Encounter (Signed)
Pt's medication was sent to pt's pharmacy as requested. Confirmation received.  °

## 2020-06-30 ENCOUNTER — Encounter: Payer: Self-pay | Admitting: Physician Assistant

## 2020-06-30 NOTE — Progress Notes (Signed)
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 Cardiac history is limited in the chart. He had a discharge summary from 2001 that outlined his anatomy, with 75% LAD and mid after diagonal 1, and a 75% ostial diagonal 1. He underwent cutting balloon PTCA of his LAD, reduced from 75% to 10% and balloon PTCA of his ostial diagonal. EF at time of cath was reported to be 40%. Subsequent echocardiogram has demonstrated normalization wtih EF 60-65%, impaired relaxation, normal RV, trivial AI by echo 03/2019. He has previously had edema which improved after cessation of amlodipine. He has history of statin intolerance as well as intolerance to Zetia due to myalgias.   He returns for follow-up overall doing very well. He has continued to exercise regularly at the [facility] without angina or dyspnea. He was fully vaccinated for Covid back in February. He does notice rare fleeting dizziness/lightheadedness. He also has noticed occasional numbness/tingling in his feet/legs when standing at the pulpit as well as occasional bilateral lower leg pain when walking or sitting. He inquires about testing for PAD. He has not had any nonhealing sores.  Labwork independently reviewed: 04/2019 K 4.6, Cr 1.31 10/2018 LDL 47, LFTs wnl 2010 CBC Hgb 14.1  Past Medical History:  Diagnosis Date  . Asthma   . Bradycardia 07/19/2015  . CAD (coronary artery disease) 2001    remote anterior STEMI in 2001 s/p cutting balloon angioplasty of the mid LAD and PTCA of ostial diagonal  . Claustrophobia 09/22/2013  . Diabetes mellitus ([place])   . ED (erectile dysfunction)   . GERD (gastroesophageal reflux disease)   . Glaucoma    Borderline  . History of colonoscopy 02/17/2003  . Hyperlipidemia   . Hypertension   . Ischemic cardiomyopathy    a. EF 40% at time of MI 2001, then normal by f/u echo at that time.  . Nephrolithiasis   . OSA (obstructive sleep apnea) 09/22/2013  . Sinus bradycardia     Past Surgical History:  Procedure Laterality Date  . CARDIAC CATHETERIZATION    . COLONOSCOPY  02/2003  . VASECTOMY      Current Medications: Current Meds  Medication Sig  . albuterol (PROVENTIL HFA;VENTOLIN HFA) 108 (90 Base) MCG/ACT inhaler Inhale 2 puffs into the lungs every 6 (six) hours as needed for wheezing or shortness of breath.  . ALPRAZolam (XANAX) 0.25 MG tablet Take 0.25 mg by mouth at bedtime as needed for anxiety.  Marland Kitchen aspirin 81 MG chewable tablet Chew 81 mg by mouth daily.  . fluticasone (FLONASE) 50 MCG/ACT nasal spray Place into both nostrils as needed. Use as directed  . folic acid (FOLVITE) 1 MG tablet Take 1 mg by mouth daily.  Marland Kitchen GARCINIA CAMBOGIA-CHROMIUM PO Take 1 capsule by mouth daily.  Javier Docker Oil 350 MG CAPS Take 1 capsule by mouth daily.  Marland Kitchen latanoprost (XALATAN) 0.005 % ophthalmic solution INSTILL 1 DROP INTO EACH EYE AT BEDTIME  . metoprolol  succinate (TOPROL-XL) 25 MG 24 hr tablet Take 0.5 tablets (12.5 mg total) by mouth daily.  . Misc Natural Products (TART CHERRY ADVANCED PO) Take 1 capsule by mouth daily.  . montelukast (SINGULAIR) 10 MG tablet Take 10 mg by mouth at bedtime.  . Multiple Vitamin (MULTIVITAMIN) tablet Take 1 tablet by mouth daily.  . nitroGLYCERIN (NITROLINGUAL) 0.4 MG/SPRAY spray Place 1 spray under the tongue every 5 (five) minutes x 3 doses as needed for chest pain.  Marland Kitchen olmesartan-hydrochlorothiazide (BENICAR HCT)  40-12.5 MG tablet Take 1 tablet by mouth daily.   Marland Kitchen omeprazole (PRILOSEC) 20 MG capsule Take 20 mg by mouth daily.  . potassium chloride SA (KLOR-CON) 20 MEQ tablet Take 1 tablet (20 mEq total) by mouth daily. Please keep upcoming appt in September before anymore refills. Thank you  . sildenafil (VIAGRA) 100 MG tablet Take 100 mg by mouth daily as needed for erectile dysfunction.    Allergies:   Ivp dye [iodinated diagnostic agents]   Social History   Socioeconomic History  . Marital status: Married    Spouse name: Not on file  . Number of children: 3  . Years of education: Not on file  . Highest education level: Not on file  Occupational History  . Occupation: RET    Comment: POSTAL  Tobacco Use  . Smoking status: Former Smoker    Years: 7.00    Types: Cigarettes  . Smokeless tobacco: Never Used  . Tobacco comment: Pt Quit 1970's  Vaping Use  . Vaping Use: Never used  Substance and Sexual Activity  . Alcohol use: No  . Drug use: No  . Sexual activity: Not on file  Other Topics Concern  . Not on file  Social History Narrative   Pt married 3 children    1 grandchild   Ret [employer]   Former smoker 1 pack x's 36 yrs    Quit in 1970's   Social Determinants of Health   Financial Resource Strain:   . Difficulty of Paying Living Expenses: Not on file  Food Insecurity:   . Worried About Charity fundraiser in the Last Year: Not on file  . Ran Out of Food in the Last Year: Not on file  Transportation Needs:   . Lack of Transportation (Medical): Not on file  . Lack of Transportation (Non-Medical): Not on file  Physical Activity:   . Days of Exercise per Week: Not on file  . Minutes of Exercise per Session: Not on file  Stress:   . Feeling of Stress : Not on file  Social Connections:   . Frequency of Communication with Friends and Family: Not on file  . Frequency of Social Gatherings with Friends and Family: Not on file  . Attends Religious Services: Not on file  .  Active Member of Clubs or Organizations: Not on file  . Attends Archivist Meetings: Not on file  . Marital Status: Not on file     Family History:  The patient's family history includes CAD in his brother; CAD (age of onset: 37) in his father; Leukemia (age of onset: 13) in his mother.  ROS:   Please see the history of present illness.  All other systems are reviewed and otherwise negative.    EKGs/Labs/Other Studies Reviewed:    Studies reviewed are outlined and summarized above. Reports included below if pertinent.  2D Echo 03/2019 1. The left ventricle has normal systolic function with an ejection  fraction of  60-65%. The cavity size was normal. Left ventricular diastolic  Doppler parameters are consistent with impaired relaxation. Indeterminate  filling pressures The E/e' is 8-15.  No evidence of left ventricular regional wall motion abnormalities.  2. The right ventricle has normal systolic function. The cavity was  normal. There is no increase in right ventricular wall thickness.  3. The mitral valve is grossly normal.  4. The tricuspid valve is grossly normal.  5. The aortic valve is tricuspid. Mild thickening of the aortic valve.  Aortic valve regurgitation is trivial by color flow Doppler. No stenosis  of the aortic valve.  6. The average left ventricular global longitudinal strain is -22.0 %.     EKG:  EKG is ordered today, personally reviewed, demonstrating SB 51bpm, no acute STT changes  Recent Labs: No results found for requested labs within last 8760 hours.  Recent Lipid Panel    Component Value Date/Time   CHOL 107 11/17/2018 0808   TRIG 121 11/17/2018 0808   HDL 36 (L) 11/17/2018 0808   CHOLHDL 3.0 11/17/2018 0808   CHOLHDL 4.7 07/16/2016 1119   VLDL 22 07/16/2016 1119   LDLCALC 47 11/17/2018 0808    PHYSICAL EXAM:    VS:  BP 110/60   Pulse (!) 51   Ht 5' 6.5" (1.689 m)   Wt 177 lb (80.3 kg)   SpO2 96%   BMI 28.14 kg/m   BMI:  Body mass index is 28.14 kg/m.  GEN: Well nourished, well developed AAM, in no acute distress HEENT: normocephalic, atraumatic Neck: no JVD, carotid bruits, or masses Cardiac: RRR; no murmurs, rubs, or gallops, no edema, 2+ pedal pulses Respiratory:  clear to auscultation bilaterally, normal work of breathing GI: soft, nontender, nondistended, + BS MS: no deformity or atrophy Skin: warm and dry, no rash Neuro:  Alert and Oriented x 3, Strength and sensation are intact, follows commands Psych: euthymic mood, full affect  Wt Readings from Last 3 Encounters:  07/02/20 177 lb (80.3 kg)  06/15/19 179 lb (81.2 kg)  04/17/19 177 lb (80.3 kg)     ASSESSMENT & PLAN:   1. CAD - doing well without angina. Continue ASA. His HR continues to be on the low side. He does report rare fleeting dizziness/lightheadedness. His blood pressure is also low-normal. Will err on the side of caution and stop his low dose metoprolol. We will plan to obtain updated baseline labs when he returns for vascular testing as below (CBC, CMET, lipid profile along with additional labs as outlined below). 2. Essential HTN - BP is normal. He plans to track his vitals over the next week following cessation of metoprolol and will send [place] readings for our review via MyChart. Obtain labs when he returns fasting. He is also on a potassium supplement so we'll follow up to see what his K level is. 3. Dyslipidemia - will update CMET/lipid panel when he returns for vascular testing. He is not fasting today. He is intolerant to statins and Zetia so will need to consider referral to lipid clinic contingent on f/u LDL level. 4. Bilateral leg numbness - also some leg pain, atypical for claudication. I do not suspect significant PAD based on exam but given CAD he is at higher risk for development so will obtain lower extremity vascular screening testing. Will also check B12/folate, Mg, and thyroid with labs when he returns for this  study. 5. Sinus bradycardia - stop metoprolol and obtain TSH as above. Otherwise will monitor clinically. He will  notify for any recurrent dizziness. In general though he feels like he is doing great.  Disposition: F/u with Dr. Radford Pax in 1 year, sooner if needed.  Medication Adjustments/Labs and Tests Ordered: Current medicines are reviewed at length with the patient today.  Concerns regarding medicines are outlined above. Medication changes, Labs and Tests ordered today are summarized above and listed in the Patient Instructions accessible in Encounters.   Signed, Charlie Pitter, PA-C  07/02/2020 10:58 AM    [address], [place], [place]  [address] Phone: [phone]; Fax: [phone]

## 2020-07-02 ENCOUNTER — Other Ambulatory Visit: Payer: Self-pay

## 2020-07-02 ENCOUNTER — Other Ambulatory Visit: Payer: Self-pay | Admitting: *Deleted

## 2020-07-02 ENCOUNTER — Ambulatory Visit: Payer: Federal, State, Local not specified - PPO | Admitting: Physician Assistant

## 2020-07-02 ENCOUNTER — Encounter: Payer: Self-pay | Admitting: Physician Assistant

## 2020-07-02 VITALS — BP 110/60 | HR 51 | Ht 66.5 in | Wt 177.0 lb

## 2020-07-02 DIAGNOSIS — I10 Essential (primary) hypertension: Secondary | ICD-10-CM | POA: Diagnosis not present

## 2020-07-02 DIAGNOSIS — R200 Anesthesia of skin: Secondary | ICD-10-CM | POA: Diagnosis not present

## 2020-07-02 DIAGNOSIS — I2510 Atherosclerotic heart disease of native coronary artery without angina pectoris: Secondary | ICD-10-CM | POA: Diagnosis not present

## 2020-07-02 DIAGNOSIS — E785 Hyperlipidemia, unspecified: Secondary | ICD-10-CM

## 2020-07-02 DIAGNOSIS — R001 Bradycardia, unspecified: Secondary | ICD-10-CM

## 2020-07-02 NOTE — Patient Instructions (Addendum)
Medication Instructions:  Your physician has recommended you make the following change in your medication:  1.  STOP Metoprolol    *If you need a refill on your cardiac medications before your next appointment, please call your pharmacy*   Lab Work: WHEN YOU GO FOR YOUR SCAN:  COME FASTING:  CMET, MAG, LIPID, CBC, TSH, B12, & FOLATE   If you have labs (blood work) drawn today and your tests are completely normal, you will receive your results only by: Marland Kitchen MyChart Message (if you have MyChart) OR . A paper copy in the mail If you have any lab test that is abnormal or we need to change your treatment, we will call you to review the results.   Testing/Procedures: Your physician has requested that you have a lower or upper extremity arterial duplex. This test is an ultrasound of the arteries in the legs or arms. It looks at arterial blood flow in the legs and arms. Allow one hour for Lower and Upper Arterial scans. There are no restrictions or special instructions    Follow-Up: At [facility], you and your health needs are our priority.  As part of our continuing mission to provide you with exceptional heart care, we have created designated Provider Care Teams.  These Care Teams include your primary Cardiologist (physician) and Advanced Practice Providers (APPs -  Physician Assistants and Nurse Practitioners) who all work together to provide you with the care you need, when you need it.  We recommend signing up for the patient portal called "MyChart".  Sign up information is provided on this After Visit Summary.  MyChart is used to connect with patients for Virtual Visits (Telemedicine).  Patients are able to view lab/test results, encounter notes, upcoming appointments, etc.  Non-urgent messages can be sent to your provider as well.   To learn more about what you can do with MyChart, go to [email or web address].    Your next appointment:   12 month(s)  The format for your next  appointment:   In Person  Provider:   You may see Armanda Magic, MD or one of the following Advanced Practice Providers on your designated Care Team:    Ronie Spies, PA-C  Jacolyn Reedy, PA-C    Other Instructions  Do not take nitroglycerin if you have taken sildenafil in the last 48 hours. The opposite is true as well. Do not take sildenafil if you have taken nitroglycerin in the last 48 hours. If you take these two medicines, they can cause low blood pressure if taken together.

## 2020-07-09 ENCOUNTER — Other Ambulatory Visit: Payer: Self-pay | Admitting: Physician Assistant

## 2020-07-09 DIAGNOSIS — R200 Anesthesia of skin: Secondary | ICD-10-CM

## 2020-07-09 DIAGNOSIS — I739 Peripheral vascular disease, unspecified: Secondary | ICD-10-CM

## 2020-07-11 ENCOUNTER — Other Ambulatory Visit: Payer: Self-pay | Admitting: Internal Medicine

## 2020-07-11 ENCOUNTER — Encounter: Payer: Self-pay | Admitting: Adult Health

## 2020-07-11 ENCOUNTER — Ambulatory Visit: Payer: Self-pay | Admitting: Adult Health

## 2020-07-11 DIAGNOSIS — E119 Type 2 diabetes mellitus without complications: Secondary | ICD-10-CM

## 2020-07-11 DIAGNOSIS — U071 COVID-19: Secondary | ICD-10-CM

## 2020-07-11 NOTE — Telephone Encounter (Signed)
Called pt, offered to transition appt to VV if he felt up to it. He declined, would like to r/s. I r/s appt with Dr. Vickey Huger for first available on 09/04/20 at 1:30pm. MM.NP did not have any sooner openings. Per MM,NP, will not charge cx fee d/t pt having covid-19.

## 2020-07-11 NOTE — Progress Notes (Signed)
I connected by phone with Scott Schwartz on 07/11/2020 at 2:40 PM to discuss the potential use of a new treatment for mild to moderate COVID-19 viral infection in non-hospitalized patients.  This patient is a 69 y.o. male that meets the FDA criteria for Emergency Use Authorization of COVID monoclonal antibody casirivimab/imdevimab.  Has a (+) direct SARS-CoV-2 viral test result  Has mild or moderate COVID-19   Is NOT hospitalized due to COVID-19  Is within 10 days of symptom onset  Has at least one of the high risk factor(s) for progression to severe COVID-19 and/or hospitalization as defined in EUA.  Specific high risk criteria : Older age (>/= 68 yo), CAD, DM   I have spoken and communicated the following to the patient or parent/caregiver regarding COVID monoclonal antibody treatment:  1. FDA has authorized the emergency use for the treatment of mild to moderate COVID-19 in adults and pediatric patients with positive results of direct SARS-CoV-2 viral testing who are 68 years of age and older weighing at least 40 kg, and who are at high risk for progressing to severe COVID-19 and/or hospitalization.  2. The significant known and potential risks and benefits of COVID monoclonal antibody, and the extent to which such potential risks and benefits are unknown.  3. Information on available alternative treatments and the risks and benefits of those alternatives, including clinical trials.  4. Patients treated with COVID monoclonal antibody should continue to self-isolate and use infection control measures (e.g., wear mask, isolate, social distance, avoid sharing personal items, clean and disinfect "high touch" surfaces, and frequent handwashing) according to CDC guidelines.   5. The patient or parent/caregiver has the option to accept or refuse COVID monoclonal antibody treatment.  After reviewing this information with the patient, The patient agreed to proceed with receiving  casirivimab\imdevimab infusion and will be provided a copy of the Fact sheet prior to receiving the infusion.  Cyndee Brightly, NP [facility]

## 2020-07-12 ENCOUNTER — Ambulatory Visit (HOSPITAL_COMMUNITY)
Admission: RE | Admit: 2020-07-12 | Discharge: 2020-07-12 | Disposition: A | Discharge status: Home / Self Care | Payer: Federal, State, Local not specified - PPO | Source: Ambulatory Visit | Attending: Pulmonary Disease | Admitting: Pulmonary Disease

## 2020-07-12 ENCOUNTER — Other Ambulatory Visit (HOSPITAL_COMMUNITY): Payer: Self-pay

## 2020-07-12 DIAGNOSIS — U071 COVID-19: Secondary | ICD-10-CM | POA: Diagnosis not present

## 2020-07-12 DIAGNOSIS — E119 Type 2 diabetes mellitus without complications: Secondary | ICD-10-CM | POA: Diagnosis present

## 2020-07-12 MED ORDER — METHYLPREDNISOLONE SODIUM SUCC 125 MG IJ SOLR: 125.0000 mg | Freq: Once | INTRAMUSCULAR | Status: DC | PRN

## 2020-07-12 MED ORDER — ALBUTEROL SULFATE HFA 108 (90 BASE) MCG/ACT IN AERS: 2.0000 | INHALATION_SPRAY | Freq: Once | RESPIRATORY_TRACT | Status: DC | PRN

## 2020-07-12 MED ORDER — SODIUM CHLORIDE 0.9 % IV SOLN: INTRAVENOUS | Status: DC | PRN

## 2020-07-12 MED ORDER — SODIUM CHLORIDE 0.9 % IV SOLN
1200.0000 mg | Freq: Once | INTRAVENOUS | Status: AC
  Administered 2020-07-12: 1200 mg via INTRAVENOUS

## 2020-07-12 MED ORDER — FAMOTIDINE IN NACL 20-0.9 MG/50ML-% IV SOLN: 20.0000 mg | Freq: Once | INTRAVENOUS | Status: DC | PRN

## 2020-07-12 MED ORDER — DIPHENHYDRAMINE HCL 50 MG/ML IJ SOLN: 50.0000 mg | Freq: Once | INTRAMUSCULAR | Status: DC | PRN

## 2020-07-12 MED ORDER — EPINEPHRINE 0.3 MG/0.3ML IJ SOAJ: 0.3000 mg | Freq: Once | INTRAMUSCULAR | Status: DC | PRN

## 2020-07-12 NOTE — Discharge Instructions (Signed)

## 2020-07-12 NOTE — Progress Notes (Signed)
  Diagnosis: COVID-19  Physician: Dr. Wright  Procedure: Covid Infusion Clinic Med: casirivimab\imdevimab infusion - Provided patient with casirivimab\imdevimab fact sheet for patients, parents and caregivers prior to infusion.  Complications: No immediate complications noted.  Discharge: Discharged home   Syrenity Klepacki M Krysten Veronica 07/12/2020  

## 2020-07-23 ENCOUNTER — Other Ambulatory Visit: Payer: Self-pay

## 2020-07-23 ENCOUNTER — Ambulatory Visit (HOSPITAL_COMMUNITY)
Admission: RE | Admit: 2020-07-23 | Discharge: 2020-07-23 | Disposition: A | Discharge status: Home / Self Care | Payer: Federal, State, Local not specified - PPO | Source: Ambulatory Visit | Attending: Cardiovascular Disease | Admitting: Cardiovascular Disease

## 2020-07-23 DIAGNOSIS — I739 Peripheral vascular disease, unspecified: Secondary | ICD-10-CM | POA: Diagnosis not present

## 2020-07-23 DIAGNOSIS — R200 Anesthesia of skin: Secondary | ICD-10-CM | POA: Diagnosis present

## 2020-07-26 ENCOUNTER — Telehealth: Payer: Self-pay | Admitting: *Deleted

## 2020-07-26 DIAGNOSIS — E785 Hyperlipidemia, unspecified: Secondary | ICD-10-CM

## 2020-07-26 LAB — COMPREHENSIVE METABOLIC PANEL
ALT: 31 IU/L (ref 0–44)
AST: 22 IU/L (ref 0–40)
Albumin/Globulin Ratio: 1.9 (ref 1.2–2.2)
Albumin: 4.7 g/dL (ref 3.8–4.8)
Alkaline Phosphatase: 59 IU/L (ref 44–121)
BUN/Creatinine Ratio: 14 (ref 10–24)
BUN: 18 mg/dL (ref 8–27)
Bilirubin Total: 0.4 mg/dL (ref 0.0–1.2)
CO2: 21 mmol/L (ref 20–29)
Calcium: 9.6 mg/dL (ref 8.6–10.2)
Chloride: 100 mmol/L (ref 96–106)
Creatinine, Ser: 1.31 mg/dL — ABNORMAL HIGH (ref 0.76–1.27)
GFR calc Af Amer: 64 mL/min/{1.73_m2} (ref >59)
GFR calc non Af Amer: 56 mL/min/{1.73_m2} — ABNORMAL LOW (ref >59)
Globulin, Total: 2.5 g/dL (ref 1.5–4.5)
Glucose: 100 mg/dL — ABNORMAL HIGH (ref 65–99)
Potassium: 4.2 mmol/L (ref 3.5–5.2)
Sodium: 139 mmol/L (ref 134–144)
Total Protein: 7.2 g/dL (ref 6.0–8.5)

## 2020-07-26 LAB — CBC
Hematocrit: 44.8 % (ref 37.5–51.0)
Hemoglobin: 15.4 g/dL (ref 13.0–17.7)
MCH: 31.7 pg (ref 26.6–33.0)
MCHC: 34.4 g/dL (ref 31.5–35.7)
MCV: 92 fL (ref 79–97)
Platelets: 242 10*3/uL (ref 150–450)
RBC: 4.86 x10E6/uL (ref 4.14–5.80)
RDW: 13.1 % (ref 11.6–15.4)
WBC: 7.8 10*3/uL (ref 3.4–10.8)

## 2020-07-26 LAB — MAGNESIUM: Magnesium: 2 mg/dL (ref 1.6–2.3)

## 2020-07-26 LAB — TSH: TSH: 1.57 u[IU]/mL (ref 0.450–4.500)

## 2020-07-26 LAB — LIPID PANEL
Chol/HDL Ratio: 6.2 ratio — ABNORMAL HIGH (ref 0.0–5.0)
Cholesterol, Total: 179 mg/dL (ref 100–199)
HDL: 29 mg/dL — ABNORMAL LOW (ref >39)
LDL Chol Calc (NIH): 105 mg/dL — ABNORMAL HIGH (ref 0–99)
Triglycerides: 261 mg/dL — ABNORMAL HIGH (ref 0–149)
VLDL Cholesterol Cal: 45 mg/dL — ABNORMAL HIGH (ref 5–40)

## 2020-07-26 LAB — VITAMIN B12: Vitamin B-12: 879 pg/mL (ref 232–1245)

## 2020-07-26 LAB — FOLATE: Folate: >20 ng/mL (ref >3.0)

## 2020-07-26 NOTE — Telephone Encounter (Signed)
-----   Message from Laurann Montana, [place] sent at 07/26/2020  9:26 AM EDT ----- Please let pt know labs overall look fine, just a few things to note: - Cr 1.31, exactly the same as last year - borderline chronic kidney disease. Remember to avoid NSAIDs (ibuprofen, motrin, advil, aleve, naproxen, etc) since these can aggravate kidney function  - cholesterol doesn't look so great. He has been intolerant to statins and Zetia. Recommend referral to lipid clinic to consider alternative agents like PCSK9 inhibitors. LDL is 105, goal is <70 and possibly even <55 given his CAD. Our goal is to be proactive in preventing future heart issues Thanks Ronie Spies PA-C

## 2020-07-29 ENCOUNTER — Other Ambulatory Visit: Payer: Self-pay | Admitting: Cardiology

## 2020-07-31 MED ORDER — POTASSIUM CHLORIDE CRYS ER 20 MEQ PO TBCR: 20.0000 meq | EXTENDED_RELEASE_TABLET | Freq: Every day | ORAL | 3 refills | Status: DC

## 2020-08-06 ENCOUNTER — Ambulatory Visit: Payer: Federal, State, Local not specified - PPO

## 2020-08-06 NOTE — Progress Notes (Deleted)
Patient ID: Scott Schwartz                 DOB: December 28, 1950                    MRN: [id number]     HPI: Scott Schwartz is a 69 y.o. male patient of Dr Scott Schwartz referred to lipid clinic by Scott Spies, PA. PMH is significant for  CAD s/p remote anterior STEMI in 2001 s/p cutting balloon angioplasty of the mid LAD and PTCA of ostial diagonal, DM, OSA on CPAP, HTN, HLD, CKD, bradycardia, and GERD. Pt is vaccinated against COVID but did develop COVID on 9/22. He underwent lower extremity ultrasound on 07/23/20 due to reports of leg numbness at his last visit which was negative for any abnormal blood flow.   Pronounced yours Can stop krill oil, change to vascepa What other statins did pt try? Need documentation Can rechallenge or pcsk9i, has bcbs fed  Current Medications: krill oil 350mg  daily Intolerances: pravastatin 40mg  daily, ezetimibe 10mg  daily - myalgias Risk Factors: CAD s/p MI, DM, HTN, fam hx of premature CAD LDL goal: 55mg /dL  Diet:   Exercise:   Family History: CAD in his brother; CAD (age of onset: 88) in his father; Leukemia (age of onset: 101) in his mother.  Social History: Pt is a , formerly worked for the [employer]. Former smoker, quit in the 1970s, denies alcohol and illicit drug use.  Labs: 07/25/20: TC 179, TG 261, HDL 29, LDL 105 (on krill oil)  Past Medical History:  Diagnosis Date  . Asthma   . Bradycardia 07/19/2015  . CAD (coronary artery disease) 2001   remote anterior STEMI in 2001 s/p cutting balloon angioplasty of the mid LAD and PTCA of ostial diagonal  . Claustrophobia 09/22/2013  . Diabetes mellitus ([facility])   . ED (erectile dysfunction)   . GERD (gastroesophageal reflux disease)   . Glaucoma    Borderline  . History of colonoscopy 02/17/2003  . Hyperlipidemia   . Hypertension   . Ischemic cardiomyopathy    a. EF 40% at time of MI 2001, then normal by f/u echo at that time.  . Nephrolithiasis   . OSA (obstructive sleep apnea) 09/22/2013  . Sinus  bradycardia     Current Outpatient Medications on File Prior to Visit  Medication Sig Dispense Refill  . albuterol (PROVENTIL HFA;VENTOLIN HFA) 108 (90 Base) MCG/ACT inhaler Inhale 2 puffs into the lungs every 6 (six) hours as needed for wheezing or shortness of breath.    . ALPRAZolam (XANAX) 0.25 MG tablet Take 0.25 mg by mouth at bedtime as needed for anxiety.    04/19/2003 aspirin 81 MG chewable tablet Chew 81 mg by mouth daily.    . fluticasone (FLONASE) 50 MCG/ACT nasal spray Place into both nostrils as needed. Use as directed    . folic acid (FOLVITE) 1 MG tablet Take 1 mg by mouth daily.    2002 GARCINIA CAMBOGIA-CHROMIUM PO Take 1 capsule by mouth daily.    14/02/2013 Oil 350 MG CAPS Take 1 capsule by mouth daily.    Marland Kitchen latanoprost (XALATAN) 0.005 % ophthalmic solution INSTILL 1 DROP INTO EACH EYE AT BEDTIME    . Misc Natural Products (TART CHERRY ADVANCED PO) Take 1 capsule by mouth daily.    . montelukast (SINGULAIR) 10 MG tablet Take 10 mg by mouth at bedtime.    . Multiple Vitamin (MULTIVITAMIN) tablet Take 1 tablet by mouth daily.    Marland Kitchen  nitroGLYCERIN (NITROLINGUAL) 0.4 MG/SPRAY spray Place 1 spray under the tongue every 5 (five) minutes x 3 doses as needed for chest pain.    Marland Kitchen olmesartan-hydrochlorothiazide (BENICAR HCT) 40-12.5 MG tablet Take 1 tablet by mouth daily.     Marland Kitchen omeprazole (PRILOSEC) 20 MG capsule Take 20 mg by mouth daily.    . potassium chloride SA (KLOR-CON) 20 MEQ tablet Take 1 tablet (20 mEq total) by mouth daily. 90 tablet 3  . sildenafil (VIAGRA) 100 MG tablet Take 100 mg by mouth daily as needed for erectile dysfunction.     No current facility-administered medications on file prior to visit.    Allergies  Allergen Reactions  . Ivp Dye [Iodinated Diagnostic Agents] Shortness Of Breath    Assessment/Plan:  1. Hyperlipidemia -

## 2020-08-06 NOTE — Progress Notes (Deleted)
Patient ID: Scott Schwartz                 DOB: [birth year]                    MRN: [id number]     HPI: Magnum Lunde is a 69 y.o. male patient of Dr Mayford Knife referred to lipid clinic by Ronie Spies, PA. PMH is significant for  CAD s/p remote anterior STEMI in 2001 s/p cutting balloon angioplasty of the mid LAD and PTCA of ostial diagonal, DM, OSA on CPAP, HTN, HLD, CKD, bradycardia, and GERD. Pt is vaccinated against COVID but did develop COVID on 9/22. He underwent lower extremity ultrasound on 07/23/20 due to reports of leg numbness at his last visit which was negative for any abnormal blood flow.   Can stop krill oil, change to vascepa What other statins did pt try? Need documentation Can rechallenge or pcsk9i, has bcbs fed  Current Medications: krill oil 350mg  daily Intolerances: pravastatin 40mg  daily, ezetimibe 10mg  daily - myalgias Risk Factors: CAD s/p MI, DM, HTN, fam hx of premature CAD LDL goal: 55mg /dL  Diet:   Exercise:   Family History: CAD in his brother; CAD (age of onset: 61) in his father; Leukemia (age of onset: 1) in his mother.  Social History: Pt is a , formerly worked for the [employer]. Former smoker, quit in the 1970s, denies alcohol and illicit drug use.  Labs: 07/25/20: TC 179, TG 261, HDL 29, LDL 105 (on krill oil)  Past Medical History:  Diagnosis Date  . Asthma   . Bradycardia 07/19/2015  . CAD (coronary artery disease) 2001   remote anterior STEMI in 2001 s/p cutting balloon angioplasty of the mid LAD and PTCA of ostial diagonal  . Claustrophobia 09/22/2013  . Diabetes mellitus (HCC)   . ED (erectile dysfunction)   . GERD (gastroesophageal reflux disease)   . Glaucoma    Borderline  . History of colonoscopy 02/17/2003  . Hyperlipidemia   . Hypertension   . Ischemic cardiomyopathy    a. EF 40% at time of MI 2001, then normal by f/u echo at that time.  . Nephrolithiasis   . OSA (obstructive sleep apnea) 09/22/2013  . Sinus bradycardia      Current Outpatient Medications on File Prior to Visit  Medication Sig Dispense Refill  . albuterol (PROVENTIL HFA;VENTOLIN HFA) 108 (90 Base) MCG/ACT inhaler Inhale 2 puffs into the lungs every 6 (six) hours as needed for wheezing or shortness of breath.    . ALPRAZolam (XANAX) 0.25 MG tablet Take 0.25 mg by mouth at bedtime as needed for anxiety.    04/19/2003 aspirin 81 MG chewable tablet Chew 81 mg by mouth daily.    . fluticasone (FLONASE) 50 MCG/ACT nasal spray Place into both nostrils as needed. Use as directed    . folic acid (FOLVITE) 1 MG tablet Take 1 mg by mouth daily.    2002 GARCINIA CAMBOGIA-CHROMIUM PO Take 1 capsule by mouth daily.    14/02/2013 Oil 350 MG CAPS Take 1 capsule by mouth daily.    Marland Kitchen latanoprost (XALATAN) 0.005 % ophthalmic solution INSTILL 1 DROP INTO EACH EYE AT BEDTIME    . Misc Natural Products (TART CHERRY ADVANCED PO) Take 1 capsule by mouth daily.    . montelukast (SINGULAIR) 10 MG tablet Take 10 mg by mouth at bedtime.    . Multiple Vitamin (MULTIVITAMIN) tablet Take 1 tablet by mouth daily.    . nitroGLYCERIN (  NITROLINGUAL) 0.4 MG/SPRAY spray Place 1 spray under the tongue every 5 (five) minutes x 3 doses as needed for chest pain.    Marland Kitchen olmesartan-hydrochlorothiazide (BENICAR HCT) 40-12.5 MG tablet Take 1 tablet by mouth daily.     Marland Kitchen omeprazole (PRILOSEC) 20 MG capsule Take 20 mg by mouth daily.    . potassium chloride SA (KLOR-CON) 20 MEQ tablet Take 1 tablet (20 mEq total) by mouth daily. 90 tablet 3  . sildenafil (VIAGRA) 100 MG tablet Take 100 mg by mouth daily as needed for erectile dysfunction.     No current facility-administered medications on file prior to visit.    Allergies  Allergen Reactions  . Ivp Dye [Iodinated Diagnostic Agents] Shortness Of Breath    Assessment/Plan:  1. Hyperlipidemia -

## 2020-08-08 NOTE — Progress Notes (Signed)
Patient ID: Scott Schwartz                 DOB: 11-03-1950                      MRN: [id number]     HPI: Anh Mangano is a 69 y.o. male patient of Dr Mayford Knife referred to lipid clinic by Ronie Spies, PA. PMH is significant for  CAD s/p remote anterior STEMI in 2001 s/p cutting balloon angioplasty of the mid LAD and PTCA of ostial diagonal, DM, OSA on CPAP, HTN, HLD, CKD, bradycardia, and GERD. Pt is vaccinated against COVID but did develop COVID on 9/22. He underwent lower extremity ultrasound on 07/23/20 due to reports of leg numbness at his last visit which was negative for any abnormal blood flow.   The patient has previously tried pravastatin 40 mg (discontinued 2016), and ezetimibe 10 mg (discotinued 11/2019), but both were discontinued as a result of reported myalgia. Per previous patient encounters, the patient is very scared to initiate additional statin therapy for fear of side effects.   Patient is doing well today. Patient confirms that he knows that he is here as a result of elevated cholesterol. He states that he had to recently discontinue ezetimibe as a result of leg & joint pain. The patient thinks that he may have PAD and has talked to his cardiologist about this. The patient states that it was mainly numbness & aching in his legs and this has continued after discontinuation of ezetimibe. The patient states that he discontinued pravastatin previously because he was scared that the medication had not been studied well enough in the minority population. He states that he is really resistant to trying a statin today.   The patient denies chest pain/angina & has not needed NTG tablets in a very long time. The patient is very motivated to get off of medications and would like [place] to talk extensively about diet and lifestyle modification that could get his cholesterol levels lower. He states that he has a 33 month old granddaughter and that he would like to be around a long time for her, so is  motivated to make the needed changes to ensure that his heart "is healthy".  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   Current Medications: krill oil 350mg  daily Intolerances: pravastatin 40mg  daily, ezetimibe 10mg  daily - myalgias Risk Factors: CAD s/p MI, DM, HTN, fam hx of premature CAD LDL goal: 55mg /dL  Diet:   - Eats out a great deal, though wife is trying to cook more at hime - Would like to loose weight  - Breakfast: bacon, oatmeal, boiled eggs -  Mainly eats chicken (grilled and broiled), hamburger meat, fish  - A good deal of fruit: Bananas & Apples - Drinks a lot of sugary drinks (juices primarily)  Exercise:  -  Does not exercise currently - Will go to the [employer] on occasional   Family History: CAD in his brother; CAD (age of onset: 45) in his father; Leukemia (age of onset: 17) in his mother.  Social History: Pt is a [place], formerly worked for the [employer]. Former smoker, quit in the 1970s, denies alcohol and illicit drug use.  Labs: 07/25/20: TC 179, TG 261, HDL 29, LDL 105 (on krill oil) Latest Scr. 1.13 07/26/20  -  CrCL 61 ml/min      Past Medical History:  Diagnosis Date  . Asthma   . Bradycardia 07/19/2015  . CAD (coronary artery disease) 2001   remote anterior STEMI in 2001 s/p cutting balloon angioplasty of the mid LAD and PTCA of ostial diagonal  . Claustrophobia 09/22/2013  . Diabetes mellitus (HCC)   . ED (erectile dysfunction)   . GERD (gastroesophageal reflux disease)   . Glaucoma    Borderline  . History of colonoscopy 02/17/2003  . Hyperlipidemia   . Hypertension   . Ischemic cardiomyopathy    a. EF 40% at time of MI 2001, then normal by f/u echo at that time.  . Nephrolithiasis   . OSA (obstructive sleep apnea) 09/22/2013  . Sinus bradycardia           Current Outpatient  Medications on File Prior to Visit  Medication Sig Dispense Refill  . albuterol (PROVENTIL HFA;VENTOLIN HFA) 108 (90 Base) MCG/ACT inhaler Inhale 2 puffs into the lungs every 6 (six) hours as needed for wheezing or shortness of breath.    . ALPRAZolam (XANAX) 0.25 MG tablet Take 0.25 mg by mouth at bedtime as needed for anxiety.    Marland Kitchen aspirin 81 MG chewable tablet Chew 81 mg by mouth daily.    . fluticasone (FLONASE) 50 MCG/ACT nasal spray Place into both nostrils as needed. Use as directed    . folic acid (FOLVITE) 1 MG tablet Take 1 mg by mouth daily.    Marland Kitchen GARCINIA CAMBOGIA-CHROMIUM PO Take 1 capsule by mouth daily.    Boris Lown Oil 350 MG CAPS Take 1 capsule by mouth daily.    Marland Kitchen latanoprost (XALATAN) 0.005 % ophthalmic solution INSTILL 1 DROP INTO EACH EYE AT BEDTIME    . Misc Natural Products (TART CHERRY ADVANCED PO) Take 1 capsule by mouth daily.    . montelukast (SINGULAIR) 10 MG tablet Take 10 mg by mouth at bedtime.    . Multiple Vitamin (MULTIVITAMIN) tablet Take 1 tablet by mouth daily.    . nitroGLYCERIN (NITROLINGUAL) 0.4 MG/SPRAY spray Place 1 spray under the tongue every 5 (five) minutes x 3 doses as needed for chest pain.    Marland Kitchen olmesartan-hydrochlorothiazide (BENICAR HCT) 40-12.5 MG tablet Take 1 tablet by mouth daily.     Marland Kitchen omeprazole (PRILOSEC) 20 MG capsule Take 20 mg by mouth daily.    . potassium chloride SA (KLOR-CON) 20 MEQ tablet Take 1 tablet (20 mEq total) by mouth daily. 90 tablet 3  . sildenafil (VIAGRA) 100 MG tablet Take 100 mg by mouth daily as needed for erectile dysfunction.     No current facility-administered medications on file prior to visit.        Allergies  Allergen Reactions  . Ivp Dye [Iodinated Diagnostic Agents] Shortness Of Breath    Assessment/Plan:  1. Hyperlipidemia -  The patient's LDL is above goal. An LDL goal of <55 would be preferable due to his history of STEMI with multiple risk factors for a repeat  event. As such, the patient will need a ~50% reduction in LDL to get him to goal. The patient previously tried pravastatin. Discontinuation of this therapy was a result of fear of potential side effects, so the patient has not yet failed statin therapy. The patient's reported side effects of mylagia on Ezetimibe were likely not medication induced given the presentation of the symptoms inclusive of numbness, and the fact that these symptoms have persisted while the patient has been off of  medication x many months. After the patient was informed that Zetia monotherapy will not achieve the targeted LDL goal for someone with his heart disease and risk factors and that there has not been mortality benefit associated with Zetia monotherapy, the patient agreed to the initiation of statin therapy. Will elect to try rosuvastatin at a low dose per patient wishes and it's association with low rates of myalgia. Thus, instructed patient to start rosuvastatin 5 mg daily.   Instructed the patient to discontinue [place] as this medication has not shown data with LDL or Tg improvement. Diet and exercise were discussed at length with the patient and the patient was encouraged to research the mediterranean diet and look at the American Diabetes Association website. Though the patient's Tg are elevated today, will defer targeted management until the patient has been established on statin therapy and made the discussed lifestyle modifications.    Will get repeat fasting lipid panel in 3 mo to assess LDL lowering.   Thank you,  Colin Broach, Pharmacy Student Class of [address] [place], [place].D, BCPS, CPP [facility]  [address], [place], [place] [address]  Phone: [phone]; Fax: [phone]

## 2020-08-09 ENCOUNTER — Ambulatory Visit (INDEPENDENT_AMBULATORY_CARE_PROVIDER_SITE_OTHER): Payer: Federal, State, Local not specified - PPO | Admitting: Pharmacist

## 2020-08-09 ENCOUNTER — Other Ambulatory Visit: Payer: Self-pay

## 2020-08-09 DIAGNOSIS — R001 Bradycardia, unspecified: Secondary | ICD-10-CM

## 2020-08-09 DIAGNOSIS — E785 Hyperlipidemia, unspecified: Secondary | ICD-10-CM | POA: Diagnosis not present

## 2020-08-09 MED ORDER — ROSUVASTATIN CALCIUM 5 MG PO TABS: 5.0000 mg | ORAL_TABLET | Freq: Every day | ORAL | 3 refills | Status: DC

## 2020-08-09 NOTE — Patient Instructions (Signed)
Thank you for seeing [place] today!  As we talked about today, your LDL is elevated. We would like to get your LDL to <55 (it is currently >100). As such, we would like to start you on rosuvastatin 5mg  daily (Crestor). We will not be restarting the ezetimibe today. We will get repeat fasting lipid labs on you in 3 months. This medication can cause muscle pain and cramping on rare occasion. If this occurs please call and we can talk about other medications to try.  I recommend that you STOP taking Krill Oil. START taking rosuvastatin 5 mg daily.   We also talked about your diet at great deal. I would encourage you to look into the mediterranean diet and the American Diet Assocociation's website.   Please come back for fasting lipid labs on January 7th. You can come in anytime after 7:30. Please make sure that you do not eat for 8 hours before coming in for the labs.   If you need 01-25-1979, please call us directly at [phone].  Thank you, Abby

## 2020-09-04 ENCOUNTER — Encounter: Payer: Self-pay | Admitting: Neurology

## 2020-09-04 ENCOUNTER — Ambulatory Visit: Payer: Federal, State, Local not specified - PPO | Admitting: Neurology

## 2020-09-04 VITALS — BP 118/74 | HR 54 | Ht 67.0 in | Wt 174.0 lb

## 2020-09-04 DIAGNOSIS — Z7189 Other specified counseling: Secondary | ICD-10-CM | POA: Diagnosis not present

## 2020-09-04 DIAGNOSIS — R430 Anosmia: Secondary | ICD-10-CM

## 2020-09-04 DIAGNOSIS — Z9989 Dependence on other enabling machines and devices: Secondary | ICD-10-CM

## 2020-09-04 DIAGNOSIS — R001 Bradycardia, unspecified: Secondary | ICD-10-CM | POA: Diagnosis not present

## 2020-09-04 DIAGNOSIS — G4733 Obstructive sleep apnea (adult) (pediatric): Secondary | ICD-10-CM | POA: Diagnosis not present

## 2020-09-04 DIAGNOSIS — U071 COVID-19: Secondary | ICD-10-CM | POA: Diagnosis not present

## 2020-09-04 DIAGNOSIS — R432 Parageusia: Secondary | ICD-10-CM

## 2020-09-04 NOTE — Progress Notes (Signed)
SLEEP MEDICINE CLINIC   Provider:  Larey Seat, M D  Referring Provider: Leanna Battles, MD Primary Care Physician:  Leanna Battles, MD  Chief Complaint  Patient presents with   Follow-up    pt alone, rm 10. presents today for yearly cpap follow up. pt admits that he has not been using the machine. he had gotten covid in sept and also was in need of new headgear and has not gotten back to wearing it. since covid still recovering with taste and smell. DME [facility] but recently had purchase online through [place].     HPI:  Scott Schwartz is a 69 y.o. male , seen here as a referral from Dr. Philip Aspen for a re evaluation of OSA and CPAP needs.  RV 09-04-2020: Scott Schwartz is a 69 y.o. male , seen here as a referral from Dr. Philip Aspen for a re evaluation of OSA and CPAP needs. Scott Schwartz reports today that he is 7 asthmatic and has been fully vaccinated with 2 shots of 5 as did his wife when both contracted Covid in late September 2021.  They did not need hospitalization but were given antibody infusion treatment at [facility].  The very next day they felt much better however he is still complaining of a inability to smell and taste.  The patient has not restarted his CPAP since then data from August and September before his infection showed that he use the machine 88% of the time with an average of 5 hours and 44 minutes the machine is set at a pressure of 6 cmH2O and he has a high expiratory pressure to a pressure relief level of 3 cm.  Residual AHI was 2.3 he did have high air leaks which are probably related to using a nasal pillow.  Based on these data I would like for him to continue on with the new CPAP however his machine is now over 50 years old and he would be due for a new one.  I like for him to resume what he has used so far until we get a new one due to supply chain chain issues in the world all over CPAP is now will take 3 sometimes 4 months to be delivered so I will order  a home sleep test to just confirm that he still has apnea and to what degree and will then order an auto titration CPAP for home use.  In the meantime I like him to stay on what he is using now.  The other part of our visit today is to discuss the [facility] smell school for people with anosmia and ageusia.  Part of the plan is basically to have an image of what substance 1 does smell or diastased to 3 neck and rewire brain centers.  Often patients report that they regain an ability to smell and taste but they do not find it as this differentiating as it used to be.  So these are association forming exercises that I used in speech therapy for example for many decades.      06/15/2017 ; The patient is now using an auto titration device , but it is set at 6 cm water , 3 cm EPR. AHI 1.7 but compliance is  43 % only. He works as a Theme park manager, and laughingly replied" there is no part time pastoral care " . 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 He is retired from [employer].  He sleeps well  on CPAP, sound and does not snore.    Scott Schwartz underwent a sleep study on 03-24-2012 which confirmed the presence of sleep apnea at an AHI of 14 and an RDI of 26. During REM sleep he did not have accentuated higher number of apneas but in supine sleep his AHI was 33.8. He had minimum desaturations at the time and was titrated to 8 cm water. Best effective pressures seem to be 7 cm with a flex setting of 2 cm water for the PE are which means expiratory pressure relief. We met shortly after the sleep study and the patient switched from a facial mask to a nasal pillow which he has used since. He states that his machine already originated from 2003 when he was for the first time evaluated, diagnosed with OSA and titrated to CPAP. This machine no longer works for him and he is definitely overdue to receive a new one. The patient's past medical history is positive for diabetes mellitus with renal  manifestations, type II.  Coronary artery disease with no angina symptoms, hypertension treatment with amlodipine, losartan, propranolol. Nitroglycerin lingual spray as needed. Hyperlipidemia was started on pravastatin 40 mrem tablets, known sleep apnea history diagnosed for the first time 2003 we diagnosed in 2013 . Chief complaint according to patient : Sleep is fragmented, interrupted and no longer restorative. The patient reports that his wife has noted him to snore again loudly and to have irregular breathing.  Sleep habits are as follows: The patient reports that he usually goes to sleep at about 11:30 PM most nights he will fall asleep promptly. He does not stay asleep. Reports that he wakes up as early as 4:00 AM spontaneously also he doesn't have to leave the bed at this time. He shares the cool, quiet , dark bedroom  With his wife. In any nights he would only get about 4 hours of sleep. Even in that short period of time he will wake up once or twice at least. And at least one of these breaks is a bathroom break. He does not wake up with headaches, but a very dry mouth. He sleeps on multiple pillows, starting off on his side but usually during the night resumes the supine sleep position. This is also when he likely snores the loudest.  Interval history from 04/01/2016 Scott Schwartz underwent a new sleep study on 12/13/2015, this revealed an AHI of 11, supine AHI is 35 and interestingly in non-REM AHI of 12 over 4 in REM. Positive airway pressure therapy was still indicated and the patient returned for titration on 01/27/2016 the AHI was 0.4 and he tolerated 6 cm water pressure very well. Oxygen nadir was 90%. He also brought me his current compliance report the patient has used the machine 29 of 30 days which is 97% and 25 out of 30 days over 4 hours which is 83% compliance average user time is 5 hours and 11 minutes, set pressure of 6 and an AHI of 1.5. The needs to be no adjustments made. The  patient's new machine is working well for him and she also is using a new interface.  DME [facility], near [facility] . 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   Review of Systems: Out of a complete 14 system review, the patient complains of only the  following symptoms, and all other reviewed systems are negative.  Snoring, apnea, fatigue , EDS, no SOB, coughing or sinus trouble.. Averages 6 hours of nightly sleep.   Epworth score 7 , Fatigue severity score 14 , geriatric depression score at 1/15 points   Needs to resume post Covid 19 infection just over 6 weeks ago.   OSA treatments,  No more SOB, no dry hacking cough, he recovered very quickly after rizumab infusion. Had flank pain.  Ageusia and Anosmia.   Social History   Socioeconomic History   Marital status: Married    Spouse name: Not on file   Number of children: 3   Years of education: Not on file   Highest education level: Not on file  Occupational History   Occupation: RET    Comment: POSTAL  Tobacco Use   Smoking status: Former Smoker    Years: 7.00    Types: Cigarettes   Smokeless tobacco: Never Used   Tobacco comment: Pt Quit 1970's  Vaping Use   Vaping Use: Never used  Substance and Sexual Activity   Alcohol use: No   Drug use: No   Sexual activity: Not on file  Other Topics Concern   Not on file  Social History Narrative   Pt married 3 children    1 grandchild   Ret [employer]   Former smoker 1 pack x's 36 yrs    Quit in 1970's   Social Determinants of Radio broadcast assistant Strain:    Difficulty of Paying Living Expenses: Not on file  Food Insecurity:    Worried About Charity fundraiser in the Last Year: Not on file   [employer] of Food in the Last Year: Not on file  Transportation Needs:    Lack of Transportation (Medical): Not on file    Lack of Transportation (Non-Medical): Not on file  Physical Activity:    Days of Exercise per Week: Not on file   Minutes of Exercise per Session: Not on file  Stress:    Feeling of Stress : Not on file  Social Connections:    Frequency of Communication with Friends and Family: Not on file   Frequency of Social Gatherings with Friends and Family: Not on file   Attends Religious Services: Not on file   Active Member of Clubs or Organizations: Not on file   Attends Archivist Meetings: Not on file   Marital Status: Not on file  Intimate Partner Violence:    Fear of Current or Ex-Partner: Not on file   Emotionally Abused: Not on file   Physically Abused: Not on file   Sexually Abused: Not on file    Family History  Problem Relation Age of Onset   Leukemia Mother 40   CAD Father 24   CAD Brother     Past Medical History:  Diagnosis Date   Asthma    Bradycardia 07/19/2015   CAD (coronary artery disease) 2001   remote anterior STEMI in 2001 s/p cutting balloon angioplasty of the mid LAD and PTCA of ostial diagonal   Claustrophobia 09/22/2013   Diabetes mellitus ([place])    ED (erectile dysfunction)    GERD (gastroesophageal reflux disease)    Glaucoma    Borderline   History of colonoscopy 02/17/2003   Hyperlipidemia    Hypertension    Ischemic cardiomyopathy    a. EF 40% at time of MI 2001, then normal by f/u echo at that time.   Nephrolithiasis  OSA (obstructive sleep apnea) 09/22/2013   Sinus bradycardia     Past Surgical History:  Procedure Laterality Date   CARDIAC CATHETERIZATION     COLONOSCOPY  02/2003   VASECTOMY      Current Outpatient Medications  Medication Sig Dispense Refill   albuterol (PROVENTIL HFA;VENTOLIN HFA) 108 (90 Base) MCG/ACT inhaler Inhale 2 puffs into the lungs every 6 (six) hours as needed for wheezing or shortness of breath.     ALPRAZolam (XANAX) 0.25 MG tablet Take 0.25 mg by mouth at bedtime as  needed for anxiety.     aspirin 81 MG chewable tablet Chew 81 mg by mouth daily.     fluticasone (FLONASE) 50 MCG/ACT nasal spray Place into both nostrils as needed. Use as directed     folic acid (FOLVITE) 1 MG tablet Take 1 mg by mouth daily.     GARCINIA CAMBOGIA-CHROMIUM PO Take 1 capsule by mouth daily.     latanoprost (XALATAN) 0.005 % ophthalmic solution INSTILL 1 DROP INTO EACH EYE AT BEDTIME     Misc Natural Products (TART CHERRY ADVANCED PO) Take 1 capsule by mouth daily.     montelukast (SINGULAIR) 10 MG tablet Take 10 mg by mouth at bedtime.     Multiple Vitamin (MULTIVITAMIN) tablet Take 1 tablet by mouth daily.     nitroGLYCERIN (NITROLINGUAL) 0.4 MG/SPRAY spray Place 1 spray under the tongue every 5 (five) minutes x 3 doses as needed for chest pain.     olmesartan-hydrochlorothiazide (BENICAR HCT) 40-12.5 MG tablet Take 1 tablet by mouth daily.      omeprazole (PRILOSEC) 20 MG capsule Take 20 mg by mouth daily.     potassium chloride SA (KLOR-CON) 20 MEQ tablet Take 1 tablet (20 mEq total) by mouth daily. 90 tablet 3   rosuvastatin (CRESTOR) 5 MG tablet Take 1 tablet (5 mg total) by mouth daily. 90 tablet 3   sildenafil (VIAGRA) 100 MG tablet Take 100 mg by mouth daily as needed for erectile dysfunction.     No current facility-administered medications for this visit.    Allergies as of 09/04/2020 - Review Complete 09/04/2020  Allergen Reaction Noted   Ivp dye [iodinated diagnostic agents] Shortness Of Breath 09/22/2013    Vitals: BP 118/74    Pulse (!) 54    Ht $R'5\' 7"'Si$  (1.702 m)    Wt 174 lb (78.9 kg)    BMI 27.25 kg/m  Last Weight:  Wt Readings from Last 1 Encounters:  09/04/20 174 lb (78.9 kg)   WFU:XNAT mass index is 27.25 kg/m.     Last Height:   Ht Readings from Last 1 Encounters:  09/04/20 $RemoveB'5\' 7"'mEDoFzvK$  (1.702 m)    Physical exam:  General: The patient is awake, alert and appears not in acute distress. The patient is well groomed. Head: Normocephalic,  atraumatic. Neck is supple. Mallampati 3,  neck circumference:16. Nasal airflow unrestricited   Cardiovascular:  Regular rate and rhythm, without  murmurs or carotid bruit, and without distended neck veins. Respiratory: Lungs are clear to auscultation. Neurologic exam : The patient is awake and alert, oriented to place and time.   Memory subjective  described as intact.  Has mild  Dysphonia ( hoarseness)  , not  aphasia. Reports no dysphagia. Mood and affect are appropriate, calm and content.   09-04-2020: severe loss of taste and smell, ageusia and anosmia.  Orange: not identified by taste or smell. Vanilla : smelled , but no taste  Coffee- not smelled nor tasted  Rum- some smell - not tasted.  Coconut- no smell identification, no taste-  Anis- neither Peppermint- tingling sensation in nose, taste  Identified as "spicey, sharp" mistook it for menthol.  Ginger- identified some spice, tasted like soap"  Cranial nerves: Pupils are equal and briskly reactive to light. Funduscopic exam without  evidence of pallor or edema. Extraocular movements  in vertical and horizontal planes intact and without nystagmus. Visual fields by finger perimetry are intact.Facial sensation intact to fine touch.  Facial motor strength is symmetric and tongue and uvula move midline. Shoulder shrug was symmetrical.   Motor exam: coordination , finger to nose:  Without tremor dysmetria - intact  Normal tone, muscle bulk and symmetric strength in all extremities. DTR symmetric. Down going Babinski.   The patient was advised of the nature of the diagnosed sleep disorder , the treatment options and risks for general a health and wellness arising from not treating the condition.  I spent more than 30 minutes of face to face time with the patient. Greater than 50% of time was spent in counseling and coordination of care. We have discussed the diagnosis and differential and I answered the patient's questions.      Assessment:  After physical and neurologic examination, review of laboratory studies,  Personal review of imaging studies, reports of other /same  Imaging studies.  Results of polysomnography/ neurophysiology testing and pre-existing records as far as provided in visit., my assessment is   Postviral syndrome with higher fatigue, post covid anosmia and ageusia.   1) continue CPAP use at 6 cm with EPR of 3  for treatment of OSA, he reached an AHI of 2.3 , very good.  But his compliance had dropped in late September 2021,  He needs to resume CPAP. Needs a new machine , too. He may have another baseline of apnea now, 6 years later .  Keeps his mask , nasal pillows, until we get a new machine ordered. ( if needed).   2) [facility] anosmia treatment.   RV in 4 month   Asencion Partridge Saba Neuman MD  09/04/2020   CC: Leanna Battles, [address],  [place] [id number]   I spent 15 minutes in total face-to-face time with the patient, more than 50% of which was spent in counseling and coordination of care, reviewing test results, reviewing medication and discussing or reviewing the diagnosis of OSA, its prognosis and treatment options.  Pertinent laboratory and imaging test results that were available during this visit with the patient were reviewed by me and considered in my medical decision making (see chart for details).

## 2020-09-04 NOTE — Patient Instructions (Addendum)
If the combination of smell and vision of the same object that youre smelling improves the recovery of smell, then just smell alone. So we call that bimodal, visual and olfactory stimulation. So the standard olfactory training is four scents: rose, lemon, cloves and eucalyptus. And the subject sniffs twice a day, 10, 12, 20 seconds each time the four essential oils for anywhere to six, eight or 12 weeks. And olfactory training for anosmia has been around for maybe 10, 15 years. You know, studies suggest it works. Theres nothing absolutely definitive. And a lot of [place] do recommend it in our practice. The idea is it cant hurt. Its thought to work by retraining the brain, the process of neuroplasticity, retraining the brain to smell these odors again. And we think that by smelling, you can retrain the brain. Now, one of the outstanding questions is, are you retraining the brain to smell rose, lemon, cloves and eucalyptus better, but forget about coffee, vanilla, coconut? How generalizable is the improvement in smell? Rosanne Ashing, we asked the patients what smells would you like to pick and train on? The answer was fascinating to [place]. I thought it might be coffee as a coffee lover. I thought it might be vanilla as a vanilla-bean ice cream lover. It was smoke. The number one odor, smell, if you will, that people wanted to train on and to get better was smoke for the reasons that we talked about before. The sense of smell is first and foremost attached to safety. And these people felt that if they could smell smoke better, they would feel more safe. Unfortunately, there isnt a smoke essential oil so we couldnt do that. But it just reinforced to [place] how important it is for patients to pick what smells they want to train on. The other unique aspect that should be brought up is that half of the group are looking at high-quality pictures of the item, one of four items they picked, and each one of them have high-quality  photographs presented on their iPhone or iPad at the same time that they smell.    Loss of smell, also known as anosmia, is one of the most common symptoms of COVID-19. The Clinical Outcomes Research Office led by [facility] Professor of Otolaryngology Loetta Rough, MD, and Assistant Professor Monika Salk, MD, has been conducting olfactory research for the past four years. The lab has investigated several treatments for persistent anosmia (loss of smell), with a special interest in viral-related anosmia.     As the number of total, confirmed COVID-19 cases increases so does the number of people suffering from disease-related anosmia, making anosmia a significant public health problem.     Monika Salk, MD, MPH Our olfactory dysfunction studies in the era of COVID-19 pandemic focus on better diagnosis and recovery of anosmia, development of new tests of anosmia, and different treatment modalities for chronic anosmia, said Dr. Maisie Fus. It is important to note all these studies can be conducted virtually.   Among several anosmia-related studies the team is conducting or planning to start soon, there are four studies of special interest:   1. COVID-19 olfactory dysfunction study (CODS)   The CODS trial will investigate sensitivity/specificity/likelihood ratio of anosmia as a presenting symptom to the diagnosis of COVID-19, and to explore prevalence and recovery as well as factors predictive of recovery for smell loss.   To determine screening test characteristics of anosmia for COVID-19, we are using a cross-sectional assessment of community-based adults who undergo  COVID-19 testing at [facility].Subjects will be identified through Epic reports that include: (a) responses to all of the screener questions including responses to the anosmia-specific question and (b) COVID-19 testing results with additional demographic and contact information.     A nested longitudinal study  will be used to explore smell recovery among subjects identified with olfactory dysfunction and confirmed COVID-19 diagnosis. Subjects enrolled in the longitudinal study will complete surveys and the objective smell test UPSIT every month until a subjects UPSIT test is in the normal smell range or for a total of five months.    This study is still open to recruitment. To date, we have successfully enrolled 65 subjects in the longitudinal study.    2. Novel anosmia screening assessment at leisure (NASAL)   The current tests for detecting olfactory dysfunction, including the [facility] [employer] (UPSIT) and the [employer], are often time-consuming, costly, and can be culturally specific. Moreover, they can often require a clinician to evaluate the results and are difficult to disseminate widely and quickly in times of need.    The scratch-n-sniff smell test The objective of our NASAL study is to elucidate the accuracy of self-reported smell identification of common household items as screening tools for olfactory dysfunction and analyze this data to develop and validate a short smell test using the best-performing household items.    NASAL study will be conducted in two phases. In Development Phase we plan to enroll 120 adults with reported smell changes. Participants will receive access to the NASAL Household Items Survey, in which they will smell a total of 45 possible items commonly found in our households and complete a set of validated surveys together with the UPSIT test. At the end of phase 1, we will create a NASAL Short Smell Test including only the best performing household items for detecting olfactory dysfunction.   In phase 2 of the NASAL study we plan to enroll 200 adults with self-reported smell loss in order to validate the NASAL Short Smell Test. Once developed and validated, this test can be beneficial in not only providing early intervention for  older persons who may suffer from neuropsychiatric disease, but also to slow the progression of the pandemic.   Enrollment for the first phase of the NASAL study has recently started and to date there are 10 subjects that have completed this phase.    3. Efficacy of bimodal visual-olfactory training in patients with COVID-19 resultant hyposmia or anosmia using patient-preferred scents (VOLT trial)   VOLT is a Phase 2 Clinical Trial that will evaluate the effects of bimodal visual-olfactory training and patient-preference on olfactory training in patients with post-COVID-19 hyposmia (reduced smell) or anosmia.    We plan to enroll 240 participants and randomize them to olfactory training using four pre-determined scents or four patient-selected scents. Olfactory training typically consists of a patient smelling a scented oil dropped in a labeled jar on a cotton ball for 10 seconds, twice daily, for 12 consecutive weeks. The same participants will also be randomized to either look at a picture representing the odor on a pre-developed study website, or to serve as controls with such no visual intervention.    The specific aims of VOLT trial are:   Explore the main effects and interaction of bimodal visual-olfactory training and patient-preferred scents in patients receiving olfactory training for hyposmia or anosmia secondary to COVID-19  Assess the patient-reported, perceived change in olfactory function and health-related quality of life after olfactory training  We just obtained IRB approval for VOLT trial and are ready to start enrollment. If youd like to participate, please complete our screening survey.  4. Smell in COVID-19 and efficacy of nasal theophylline (SCENT 2)   SCENT-2 is a phase 2 double-blinded clinical trial that builds on previous work completed from the lab. The objective of the trial is to elucidate the efficacy of high-dose intranasal theophylline irrigation in the treatment  of COVID-19 related olfactory dysfunction in comparison to placebo saline irrigation.    We plan to enroll 50 subjects. Subjects will be randomized in a 1:1 allocation to the intranasal theophylline irrigation group or the intranasal placebo irrigation group. A member of the research team will instruct participants on how to irrigate each nasal cavity with one-half of the contents of the sinus rinse bottle. Written instructions and a video demonstration will also be provided to ensure proper technique.  Irrigations will be performed twice daily - once in the morning and once at night for all subjects. Subjects will be assessed prior to start of intervention, and the 1 week, 3 weeks and 6 weeks after the start of the intervention. The change in UPSIT score will be the primary outcome for the study.   Institutional Review Board (IRB) approval is currently pending for this study.

## 2020-09-05 ENCOUNTER — Other Ambulatory Visit: Payer: Self-pay

## 2020-09-05 ENCOUNTER — Ambulatory Visit: Payer: Federal, State, Local not specified - PPO | Admitting: Sports Medicine

## 2020-09-05 ENCOUNTER — Encounter: Payer: Self-pay | Admitting: Sports Medicine

## 2020-09-05 ENCOUNTER — Ambulatory Visit (INDEPENDENT_AMBULATORY_CARE_PROVIDER_SITE_OTHER): Payer: Federal, State, Local not specified - PPO

## 2020-09-05 DIAGNOSIS — M722 Plantar fascial fibromatosis: Secondary | ICD-10-CM

## 2020-09-05 DIAGNOSIS — M79672 Pain in left foot: Secondary | ICD-10-CM

## 2020-09-05 DIAGNOSIS — M216X2 Other acquired deformities of left foot: Secondary | ICD-10-CM | POA: Diagnosis not present

## 2020-09-05 MED ORDER — TRIAMCINOLONE ACETONIDE 10 MG/ML IJ SUSP
10.0000 mg | Freq: Once | INTRAMUSCULAR | Status: AC
  Administered 2020-09-05: 10 mg

## 2020-09-05 NOTE — Progress Notes (Signed)
Subjective: Scott Schwartz is a 69 y.o. male patient presents to office with complaint of moderate heel pain on the left. Patient admits to post static dyskinesia for 6 months or so since the summer in duration. Patient has treated this problem with sketchers with no relief. Pain is 8/10. Denies any other pedal complaints.   Review of Systems  All other systems reviewed and are negative.   Patient Active Problem List   Diagnosis Date Noted  . COVID-19 09/04/2020  . 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 Ageusia 09/04/2020  . Shoulder impingement syndrome, left 10/24/2018  . CPAP use counseling 06/15/2017  . Cervical lymphadenitis 11/18/2016  . OSA on CPAP 04/01/2016  . Bradycardia 07/19/2015  . Edema of extremities 07/05/2014  . Benign essential HTN 07/05/2014  . Dyslipidemia 07/05/2014  . CAD (coronary artery disease)   . Nephrolithiasis   . Diabetes mellitus without complication ([place])   . Asthma   . Claustrophobia 09/22/2013  . Erectile dysfunction 07/21/2012  . Hypercholesterolemia 07/21/2012  . HTN (hypertension) 07/21/2012  . 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 albuterol (PROVENTIL HFA;VENTOLIN HFA) 108 (90 Base) MCG/ACT inhaler Inhale 2 puffs into the lungs every 6 (six) hours as needed for wheezing or shortness of breath.    . ALPRAZolam (XANAX) 0.25 MG tablet Take 0.25 mg by mouth at bedtime as needed for anxiety.    Marland Kitchen aspirin 81 MG chewable tablet Chew 81 mg by mouth daily.    Marland Kitchen COVID-19 Specimen Collection KIT See admin instructions. for testing    . fluticasone (FLONASE) 50 MCG/ACT nasal spray Place into both nostrils as needed. Use as directed    . folic acid (FOLVITE) 1 MG tablet Take 1 mg by mouth daily.    Marland Kitchen GARCINIA CAMBOGIA-CHROMIUM PO Take 1 capsule by mouth daily.    Marland Kitchen latanoprost (XALATAN) 0.005 % ophthalmic solution INSTILL 1 DROP INTO EACH EYE AT BEDTIME    . Misc Natural Products (TART CHERRY  ADVANCED PO) Take 1 capsule by mouth daily.    . montelukast (SINGULAIR) 10 MG tablet Take 10 mg by mouth at bedtime.    . Multiple Vitamin (MULTIVITAMIN) tablet Take 1 tablet by mouth daily.    . nitroGLYCERIN (NITROLINGUAL) 0.4 MG/SPRAY spray Place 1 spray under the tongue every 5 (five) minutes x 3 doses as needed for chest pain.    Marland Kitchen olmesartan-hydrochlorothiazide (BENICAR HCT) 40-12.5 MG tablet Take 1 tablet by mouth daily.     Marland Kitchen omeprazole (PRILOSEC) 20 MG capsule Take 20 mg by mouth daily.    . potassium chloride SA (KLOR-CON) 20 MEQ tablet Take 1 tablet (20 mEq total) by mouth daily. 90 tablet 3  . rosuvastatin (CRESTOR) 5 MG tablet Take 1 tablet (5 mg total) by mouth daily. 90 tablet 3  . sildenafil (VIAGRA) 100 MG tablet Take 100 mg by mouth daily as needed for erectile dysfunction.     No current facility-administered medications on file prior to visit.    Allergies  Allergen Reactions  . Ivp Dye [Iodinated Diagnostic Agents] Shortness Of Breath    Objective: Physical Exam General: The patient is alert and oriented x3 in no acute distress.  Dermatology: Skin is warm, dry and supple bilateral lower extremities. Nails 1-10 are normal. There is no erythema, edema, no eccymosis, no open lesions present. Integument is otherwise unremarkable.  Vascular: Dorsalis Pedis pulse and Posterior Tibial pulse are 2/4 bilateral. Capillary fill time  is immediate to all digits.  Neurological: Grossly intact to light touch bilateral.   Musculoskeletal: Tenderness to palpation at the medial calcaneal tubercale and through the insertion of the plantar fascia on the left foot. No pain with compression of calcaneus bilateral. No pain with tuning fork to calcaneus bilateral. No pain with calf compression bilateral. There is decreased Ankle joint range of motion bilateral. All other joints range of motion within normal limits bilateral. Strength 5/5 in all groups bilateral.   Gait: Unassisted,  Antalgic avoid weight on left heel  Xray,Left foot:  Normal osseous mineralization. Joint spaces preserved. No fracture/dislocation/boney destruction. Calcaneal spur present with mild thickening of plantar fascia. No other soft tissue abnormalities or radiopaque foreign bodies.   Assessment and Plan: Problem List Items Addressed This Visit    None    Visit Diagnoses    Plantar fasciitis of left foot    -  Primary   Relevant Medications   triamcinolone acetonide (KENALOG) 10 MG/ML injection 10 mg (Completed)   Other Relevant Orders   DG Foot Complete Left   Pain of left heel       Acquired equinus deformity of left foot          -Complete examination performed.  -Xrays reviewed -Discussed with patient in detail the condition of plantar fasciitis, how this occurs and general treatment options. Explained both conservative and surgical treatments.  -After oral consent and aseptic prep, injected a mixture containing 1 ml of 2% plain lidocaine, 1 ml 0.5% plain marcaine, 0.5 ml of kenalog 10 and 0.5 ml of dexamethasone phosphate into left heel. Post-injection care discussed with patient.  -Recommended good supportive shoes and advised use of Left fascial brace which was dispensed at today's visit. -Explained and dispensed to patient daily stretching exercises. -Recommend patient to ice affected area 1-2x daily. -Patient to return to office in 4 weeks for follow up or sooner if problems or questions arise.  Landis Martins, DPM

## 2020-09-05 NOTE — Patient Instructions (Signed)
Refrain from walking barefoot in the morning Before getting out of bed do stretches as below for toes stretches 30 times in the morning Recommend to wear plantar fascial brace during the day Ice for 20 minutes for 1 week to the heel using ice pack or frozen water bottle Take oral Benadryl at bedtime for the next 3 days to prevent against an injection site/allergic reaction  Plantar Fasciitis Rehab Ask your health care provider which exercises are safe for you. Do exercises exactly as told by your health care provider and adjust them as directed. It is normal to feel mild stretching, pulling, tightness, or discomfort as you do these exercises. Stop right away if you feel sudden pain or your pain gets worse. Do not begin these exercises until told by your health care provider. Stretching and range-of-motion exercises These exercises warm up your muscles and joints and improve the movement and flexibility of your foot. These exercises also help to relieve pain. Plantar fascia stretch  1. Sit with your left / right leg crossed over your opposite knee. 2. Hold your heel with one hand with that thumb near your arch. With your other hand, hold your toes and gently pull them back toward the top of your foot. You should feel a stretch on the bottom of your toes or your foot (plantar fascia) or both. 3. Hold this stretch for__________ seconds. 4. Slowly release your toes and return to the starting position. Repeat __________ times. Complete this exercise __________ times a day. Gastrocnemius stretch, standing This exercise is also called a calf (gastroc) stretch. It stretches the muscles in the back of the upper calf. 1. Stand with your hands against a wall. 2. Extend your left / right leg behind you, and bend your front knee slightly. 3. Keeping your heels on the floor and your back knee straight, shift your weight toward the wall. Do not arch your back. You should feel a gentle stretch in your upper  left / right calf. 4. Hold this position for __________ seconds. Repeat __________ times. Complete this exercise __________ times a day. Soleus stretch, standing This exercise is also called a calf (soleus) stretch. It stretches the muscles in the back of the lower calf. 1. Stand with your hands against a wall. 2. Extend your left / right leg behind you, and bend your front knee slightly. 3. Keeping your heels on the floor, bend your back knee and shift your weight slightly over your back leg. You should feel a gentle stretch deep in your lower calf. 4. Hold this position for __________ seconds. Repeat __________ times. Complete this exercise __________ times a day. Gastroc and soleus stretch, standing step This exercise stretches the muscles in the back of the lower leg. These muscles are in the upper calf (gastrocnemius) and the lower calf (soleus). 1. Stand with the ball of your left / right foot on a step. The ball of your foot is on the walking surface, right under your toes. 2. Keep your other foot firmly on the same step. 3. Hold on to the wall or a railing for balance. 4. Slowly lift your other foot, allowing your body weight to press your left / right heel down over the edge of the step. You should feel a stretch in your left / right calf. 5. Hold this position for __________ seconds. 6. Return both feet to the step. 7. Repeat this exercise with a slight bend in your left / right knee. Repeat __________ times with  your left / right knee straight and __________ times with your left / right knee bent. Complete this exercise __________ times a day. Balance exercise This exercise builds your balance and strength control of your arch to help take pressure off your plantar fascia. Single leg stand If this exercise is too easy, you can try it with your eyes closed or while standing on a pillow. 1. Without shoes, stand near a railing or in a doorway. You may hold on to the railing or door  frame as needed. 2. Stand on your left / right foot. Keep your big toe down on the floor and try to keep your arch lifted. Do not let your foot roll inward. 3. Hold this position for __________ seconds. Repeat __________ times. Complete this exercise __________ times a day. This information is not intended to replace advice given to you by your health care provider. Make sure you discuss any questions you have with your health care provider. Document Revised: 01/26/2019 Document Reviewed: 08/03/2018 Elsevier Patient Education  2020 [employer].

## 2020-09-30 ENCOUNTER — Ambulatory Visit: Payer: Federal, State, Local not specified - PPO | Admitting: Neurology

## 2020-09-30 DIAGNOSIS — G4733 Obstructive sleep apnea (adult) (pediatric): Secondary | ICD-10-CM

## 2020-09-30 DIAGNOSIS — R001 Bradycardia, unspecified: Secondary | ICD-10-CM

## 2020-09-30 DIAGNOSIS — U071 COVID-19: Secondary | ICD-10-CM

## 2020-09-30 DIAGNOSIS — Z9989 Dependence on other enabling machines and devices: Secondary | ICD-10-CM

## 2020-09-30 DIAGNOSIS — Z7189 Other specified counseling: Secondary | ICD-10-CM

## 2020-09-30 DIAGNOSIS — R430 Anosmia: Secondary | ICD-10-CM

## 2020-09-30 DIAGNOSIS — R432 Parageusia: Secondary | ICD-10-CM

## 2020-10-02 NOTE — Progress Notes (Signed)
° °  [facility]  HOME SLEEP TEST (Watch PAT)  STUDY DATE: 10/01/20  DOB: 05-Sep-1951  MRN: [id number]  ORDERING CLINICIAN: Melvyn Novas, MD   REFERRING CLINICIAN: Jarome Matin, MD   CLINICAL INFORMATION/HISTORY: Scott Schwartz is a 69 y.o. male , seen here upon referral from Dr. Eloise Harman for a re- evaluation of OSA and CPAP needs. [name] reports that he is asthmatic and has been fully vaccinated with 2 shots of vaccine (as was his wife), when both contracted Covid in late September 2021.  They did not need hospitalization but were given antibody infusion treatment at [facility].  The very next day they felt much better. However, he is still complaining of a inability to smell and taste.   The patient has not restarted his CPAP since, but was compliant before this COVID infection- data showed that he used the machine 88% of the time with an average of 5 hours and 44 minutes the machine is set at a pressure of 6 cmH2O and he has a high expiratory pressure to a pressure relief level of 3 cm.  Residual AHI was 2.3 he did have high air leaks which are probably related to using a nasal pillow.   Based on these data I would like for him to continue on with the CPAP machine,  however his machine is now over 71 years old and he would be due for a new one.  I like for him to resume what he has used so far until we get a new one due to supply chain chain issues in the [place] CPAP is now will take 3 sometimes 4 months to be delivered so I will order a home sleep test to just confirm that he still has apnea and to what degree.  Epworth sleepiness score: 7/24.  Neck Circumference: 16 "  BMI: 27.3 kg/m  FINDINGS:   Total Record Time (hours, min): 7 h 7 min  Total Sleep Time (hours, min):  5 h 39 min   Percent REM (%):    18.39 %   Calculated pAHI (per hour):  17.9      REM pAHI:  30.9     NREM pAHI:  15.1  Supine AHI: 23.1   Oxygen Saturation  (%) Mean: 93  Minimum oxygen saturation (%):         71   O2 Saturation Range (%): 71-97  O2Saturation (minutes) <=88%: 5.2 min   Pulse Mean (bpm):    50  Pulse Range (40-86)   IMPRESSION: OSA (obstructive sleep apnea) is still present at a mild to moderate range with AHI of 17.9/h , accentuated by REM sleep and supine sleep.   RECOMMENDATION:  The patient should continue to use CPAP, given the REM dependent type of OSA he has.  I will order an autotitration CPAP device with heated humidification, with a setting from 5-15 cm water, 3 cm EPR and mask of his choice.    INTERPRETING PHYSICIAN: Melvyn Novas, MD Board Certified in Neurology and Sleep Medicine [facility] [address] [place], [place] [address] [phone]

## 2020-10-16 NOTE — Progress Notes (Signed)
IMPRESSION: OSA (obstructive sleep apnea) is still present at a mild to moderate range with AHI of 17.9/h , accentuated by REM sleep and supine sleep.   RECOMMENDATION:  The patient should continue to use CPAP, given the REM dependent type of OSA he has.  I will order an autotitration CPAP device with heated humidification, with a setting from 5-15 cm water, 3 cm EPR and mask of his choice.    INTERPRETING PHYSICIAN: Porfirio Mylar Tyller Bowlby,MD

## 2020-10-16 NOTE — Addendum Note (Signed)
Addended by: Melvyn Novas on: 10/16/2020 02:06 PM   Modules accepted: Orders

## 2020-10-16 NOTE — Procedures (Signed)
[facility]  HOME SLEEP TEST (Watch PAT)  STUDY DATE: 10/01/20  DOB: 01-20-1951  MRN: [id number]  ORDERING CLINICIAN: Melvyn Novas, MD   REFERRING CLINICIAN: Jarome Matin, MD   CLINICAL INFORMATION/HISTORY: Scott Schwartz is a 69 y.o. male , seen here upon referral from Dr. Eloise Harman for a re- evaluation of OSA and CPAP needs. Mr. Valley reports that he is asthmatic and has been fully vaccinated with 2 shots of vaccine (as was his wife), when both contracted Covid in late September 2021.  They did not need hospitalization but were given antibody infusion treatment at [facility].  The very next day they felt much better. However, he is still complaining of a inability to smell and taste.   The patient has not restarted his CPAP since, but was compliant before this COVID infection- data showed that he used the machine 88% of the time with an average of 5 hours and 44 minutes the machine is set at a pressure of 6 cmH2O and he has a high expiratory pressure to a pressure relief level of 3 cm.  Residual AHI was 2.3 he did have high air leaks which are probably related to using a nasal pillow.   Based on these data I would like for him to continue on with the CPAP machine,  however his machine is now over 82 years old and he would be due for a new one.  I like for him to resume what he has used so far until we get a new one due to supply chain chain issues in the world all over CPAP is now will take 3 sometimes 4 months to be delivered so I will order a home sleep test to just confirm that he still has apnea and to what degree.  Epworth sleepiness score: 7/24.  Neck Circumference: 16 "  BMI: 27.3 kg/m  FINDINGS:   Total Record Time (hours, min): 7 h 7 min  Total Sleep Time (hours, min):  5 h 39 min   Percent REM (%):    18.39 %   Calculated pAHI (per hour):  17.9      REM pAHI:  30.9     NREM pAHI:  15.1  Supine AHI: 23.1   Oxygen Saturation (%)  Mean: 93  Minimum oxygen saturation (%):         71   O2 Saturation Range (%): 71-97  O2Saturation (minutes) <=88%: 5.2 min   Pulse Mean (bpm):    50  Pulse Range (40-86)   IMPRESSION: OSA (obstructive sleep apnea) is still present at a mild to moderate range with AHI of 17.9/h , accentuated by REM sleep and supine sleep.   RECOMMENDATION:  The patient should continue to use CPAP, given the REM dependent type of OSA he has.  I will order an autotitration CPAP device with heated humidification, with a setting from 5-15 cm water, 3 cm EPR and mask of his choice.    INTERPRETING PHYSICIAN: Melvyn Novas, MD Board Certified in Neurology and Sleep Medicine [facility] [address] [place], [place] [address] [phone]

## 2020-10-17 ENCOUNTER — Telehealth: Payer: Self-pay | Admitting: Neurology

## 2020-10-17 ENCOUNTER — Encounter: Payer: Self-pay | Admitting: Neurology

## 2020-10-17 NOTE — Telephone Encounter (Signed)
-----   Message from Melvyn Novas, MD sent at 10/16/2020  2:04 PM EST ----- IMPRESSION: OSA (obstructive sleep apnea) is still present at a mild to moderate range with AHI of 17.9/h , accentuated by REM sleep and supine sleep.   RECOMMENDATION:  The patient should continue to use CPAP, given the REM dependent type of OSA he has.  I will order an autotitration CPAP device with heated humidification, with a setting from 5-15 cm water, 3 cm EPR and mask of his choice.    INTERPRETING PHYSICIAN: Porfirio Mylar Dohmeier,MD

## 2020-10-17 NOTE — Telephone Encounter (Signed)
Called patient to discuss sleep study results. No answer at this time. LVM for the patient to call back. Will send the pt a mychart message as well.

## 2020-10-24 NOTE — Telephone Encounter (Signed)
Called the patient, this is 3rd attempt to review sleep study results. LVM advising the patient to call back.  Pt has reviewed his SS online and I see that according to [employer] he has started to use the machine again. DME [facility] is who he is set up with (or was) I will send order to [facility] so they may be able to assist with supplies and when the patient is due for a new CPAP we can get him set up on that. According to my airview the patient's current machine was set up 02/11/2016. He will be eligible for a new machine 02/10/2021. If he can get set up with a new machine between April 25-May 16 he will not need another office visit to get new CPAP.  (medicare requires a office visit within 6 months from set up of new machine. Last ov 09/04/2020 which is why would be important that he is established during those dates.) Otherwise we would need to schedule a follow up visit around early April for the patient here with MD or NP. Then another follow up visit  31-90 days from the day he picks up the new machine.  In the meantime [employer] can help with supplies for current machine.

## 2020-10-25 ENCOUNTER — Other Ambulatory Visit: Payer: Federal, State, Local not specified - PPO | Admitting: *Deleted

## 2020-10-25 ENCOUNTER — Other Ambulatory Visit: Payer: Self-pay

## 2020-10-25 DIAGNOSIS — E785 Hyperlipidemia, unspecified: Secondary | ICD-10-CM

## 2020-10-25 LAB — HEPATIC FUNCTION PANEL
ALT: 13 IU/L (ref 0–44)
AST: 18 IU/L (ref 0–40)
Albumin: 4.7 g/dL (ref 3.8–4.8)
Alkaline Phosphatase: 61 IU/L (ref 44–121)
Bilirubin Total: 0.3 mg/dL (ref 0.0–1.2)
Bilirubin, Direct: 0.11 mg/dL (ref 0.00–0.40)
Total Protein: 7.1 g/dL (ref 6.0–8.5)

## 2020-10-25 LAB — LIPID PANEL
Chol/HDL Ratio: 4.5 ratio (ref 0.0–5.0)
Cholesterol, Total: 138 mg/dL (ref 100–199)
HDL: 31 mg/dL — ABNORMAL LOW (ref >39)
LDL Chol Calc (NIH): 65 mg/dL (ref 0–99)
Triglycerides: 261 mg/dL — ABNORMAL HIGH (ref 0–149)
VLDL Cholesterol Cal: 42 mg/dL — ABNORMAL HIGH (ref 5–40)

## 2020-10-29 ENCOUNTER — Telehealth: Payer: Self-pay | Admitting: Pharmacist

## 2020-10-29 NOTE — Telephone Encounter (Signed)
Called patient to discuss lipid labs. LDL I much improved. TG still high. Currently doing well on rosuvastatin 5mg  daily. Patient requested I call him back around 1-2 because he was driving to have a procedure. Advised I would call him back. Will need to discuss increasing rosuvastatin and discuss diet due to elevated TG.

## 2020-10-29 NOTE — Telephone Encounter (Signed)
Discussed labs results with patient. LDL much better. Although doing well on the 5mg  dose, patient not interested in increasing rosuvastatin. We discussed diet and high TG. Patient consumes a good amount of sugary jucies. We talked about decreasing these. Patient in agreement. Recheck lipids in 3 months.

## 2021-01-21 ENCOUNTER — Telehealth: Payer: Self-pay

## 2021-01-21 DIAGNOSIS — E785 Hyperlipidemia, unspecified: Secondary | ICD-10-CM

## 2021-01-21 NOTE — Telephone Encounter (Signed)
Called and spoke w/pt regarding scheduling labs and pt was agreeable

## 2021-01-21 NOTE — Telephone Encounter (Signed)
-----   Message from Olene Floss, RPH-CPP sent at 01/21/2021  7:41 AM EDT -----  ----- Message ----- From: Olene Floss, RPH-CPP Sent: 01/21/2021  12:00 AM EDT To: Olene Floss, RPH-CPP  Set up lipids

## 2021-01-23 ENCOUNTER — Telehealth: Payer: Self-pay | Admitting: Pharmacist

## 2021-01-23 ENCOUNTER — Other Ambulatory Visit: Payer: Self-pay

## 2021-01-23 ENCOUNTER — Other Ambulatory Visit: Payer: Federal, State, Local not specified - PPO

## 2021-01-23 DIAGNOSIS — E785 Hyperlipidemia, unspecified: Secondary | ICD-10-CM

## 2021-01-23 LAB — LIPID PANEL
Chol/HDL Ratio: 3.5 ratio (ref 0.0–5.0)
Cholesterol, Total: 115 mg/dL (ref 100–199)
HDL: 33 mg/dL — ABNORMAL LOW (ref >39)
LDL Chol Calc (NIH): 54 mg/dL (ref 0–99)
Triglycerides: 164 mg/dL — ABNORMAL HIGH (ref 0–149)
VLDL Cholesterol Cal: 28 mg/dL (ref 5–40)

## 2021-01-23 LAB — HEPATIC FUNCTION PANEL
ALT: 14 IU/L (ref 0–44)
AST: 16 IU/L (ref 0–40)
Albumin: 4.5 g/dL (ref 3.8–4.8)
Alkaline Phosphatase: 52 IU/L (ref 44–121)
Bilirubin Total: 0.5 mg/dL (ref 0.0–1.2)
Bilirubin, Direct: 0.16 mg/dL (ref 0.00–0.40)
Total Protein: 6.9 g/dL (ref 6.0–8.5)

## 2021-01-23 NOTE — Telephone Encounter (Signed)
Called and left VM on pt machine per DPR. LDL very good. At goal. TG much improved. Still a little above goal, but much better. Continue to restrict sugars and carbs. Continue rosuvastatin 5mg  daily. Call back # left incase patient had questions.

## 2021-07-22 ENCOUNTER — Other Ambulatory Visit: Payer: Self-pay | Admitting: Cardiology

## 2021-07-23 MED ORDER — POTASSIUM CHLORIDE CRYS ER 20 MEQ PO TBCR: 20.0000 meq | EXTENDED_RELEASE_TABLET | Freq: Every day | ORAL | 0 refills | Status: DC

## 2021-07-23 MED ORDER — ROSUVASTATIN CALCIUM 5 MG PO TABS: 5.0000 mg | ORAL_TABLET | Freq: Every day | ORAL | 0 refills | Status: DC

## 2021-07-29 ENCOUNTER — Other Ambulatory Visit: Payer: Self-pay | Admitting: Cardiology

## 2021-08-19 ENCOUNTER — Ambulatory Visit: Payer: Federal, State, Local not specified - PPO | Admitting: Cardiology

## 2021-08-19 NOTE — Progress Notes (Incomplete)
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 Cardiac history is limited in the chart. He had a discharge summary from 2001 that outlined his anatomy, with 75% LAD and mid after diagonal 1, and a 75% ostial diagonal 1. He underwent cutting balloon PTCA of his LAD, reduced from 75% to 10% and balloon PTCA of his ostial diagonal. EF at time of cath was reported to be 40%. Subsequent echocardiogram has demonstrated normalization wtih EF 60-65%, impaired relaxation, normal RV, trivial AI by echo 03/2019. He has previously had edema which improved after cessation of amlodipine. He has history of statin intolerance as well as intolerance to Zetia due to myalgias.   He is here today for followup and is doing well.  He denies any chest pain or pressure, SOB, DOE, PND, orthopnea, LE edema, dizziness, palpitations or syncope. He is compliant with his meds and is tolerating meds with no SE.     Labwork independently reviewed: 04/2019 K 4.6, Cr 1.31 10/2018 LDL 47, LFTs wnl 2010 CBC Hgb 14.1  Past Medical History:  Diagnosis Date   Asthma    Bradycardia 07/19/2015   CAD (coronary artery disease) 2001   remote anterior STEMI in 2001 s/p cutting balloon angioplasty of the mid LAD and PTCA of ostial diagonal   Claustrophobia 09/22/2013   Diabetes mellitus ([name])    ED (erectile dysfunction)    GERD (gastroesophageal reflux disease)    Glaucoma     Borderline   History of colonoscopy 02/17/2003   Hyperlipidemia    Hypertension    Ischemic cardiomyopathy    a. EF 40% at time of MI 2001, then normal by f/u echo at that time.   Nephrolithiasis    OSA (obstructive sleep apnea) 09/22/2013   Sinus bradycardia     Past Surgical History:  Procedure Laterality Date   CARDIAC CATHETERIZATION     COLONOSCOPY  02/2003   VASECTOMY      Current Medications: No outpatient medications have been marked as taking for the 08/19/21 encounter (Appointment) with Scott Margarita, MD.    Allergies:   Ivp dye [iodinated diagnostic agents]   Social History   Socioeconomic History   Marital status: Married    Spouse name: Not on file   Number of children: 3   Years of education: Not on file   Highest education level: Not on file  Occupational History   Occupation: RET    Comment: POSTAL  Tobacco Use   Smoking status: Former    Years: 7.00    Types: Cigarettes   Smokeless tobacco: Never   Tobacco comments:    Pt Quit 1970's  Vaping Use   Vaping Use: Never used  Substance and Sexual Activity   Alcohol use: No   Drug use: No   Sexual activity: Not on file  Other Topics Concern   Not on file  Social History Narrative   Pt married 3 children  1 grandchild   Ret [employer]   Former smoker 1 pack x's 36 yrs    Quit in 1970's   Social Determinants of Radio broadcast assistant Strain: Not on file  Food Insecurity: Not on file  Transportation Needs: Not on file  Physical Activity: Not on file  Stress: Not on file  Social Connections: Not on file     Family History:  The patient's family history includes CAD in his brother; CAD (age of onset: 79) in his father; Leukemia (age of onset: 51) in his mother.  ROS:   Please see the history of present illness.  All other systems are reviewed and otherwise negative.    EKGs/Labs/Other Studies Reviewed:    Studies reviewed are outlined and summarized above. Reports included below  if pertinent.  2D Echo 03/2019  1. The left ventricle has normal systolic function with an ejection  fraction of 60-65%. The cavity size was normal. Left ventricular diastolic  Doppler parameters are consistent with impaired relaxation. Indeterminate  filling pressures The E/e' is 8-15.  No evidence of left ventricular regional wall motion abnormalities.   2. The right ventricle has normal systolic function. The cavity was  normal. There is no increase in right ventricular wall thickness.   3. The mitral valve is grossly normal.   4. The tricuspid valve is grossly normal.   5. The aortic valve is tricuspid. Mild thickening of the aortic valve.  Aortic valve regurgitation is trivial by color flow Doppler. No stenosis  of the aortic valve.   6. The average left ventricular global longitudinal strain is -22.0 %.     EKG:  EKG is ordered today, personally reviewed, demonstrating ***  Recent Labs: 01/23/2021: ALT 14  Recent Lipid Panel    Component Value Date/Time   CHOL 115 01/23/2021 0820   TRIG 164 (H) 01/23/2021 0820   HDL 33 (L) 01/23/2021 0820   CHOLHDL 3.5 01/23/2021 0820   CHOLHDL 4.7 07/16/2016 1119   VLDL 22 07/16/2016 1119   LDLCALC 54 01/23/2021 0820    PHYSICAL EXAM:    VS:  There were no vitals taken for this visit.  BMI: There is no height or weight on file to calculate BMI.  GEN: Well nourished, well developed in no acute distress HEENT: Normal NECK: No JVD; No carotid bruits LYMPHATICS: No lymphadenopathy CARDIAC:RRR, no murmurs, rubs, gallops RESPIRATORY:  Clear to auscultation without rales, wheezing or rhonchi  ABDOMEN: Soft, non-tender, non-distended MUSCULOSKELETAL:  No edema; No deformity  SKIN: Warm and dry NEUROLOGIC:  Alert and oriented x 3 PSYCHIATRIC:  Normal affect   Wt Readings from Last 3 Encounters:  09/04/20 174 lb (78.9 kg)  07/02/20 177 lb (80.3 kg)  06/15/19 179 lb (81.2 kg)     ASSESSMENT & PLAN:   [facility] -Cardiac cath 2001 with  75% LAD and mid after diagonal 1, and a 75% ostial diagonal 1. He underwent cutting balloon PTCA of his LAD, reduced from 75% to 10% and balloon PTCA of his ostial diagonal -doing well without angina. Continue ASA. His HR continues to be on the low side. He does report rare fleeting dizziness/lightheadedness. His blood pressure is also low-normal. Will err on the side of caution and stop his low dose metoprolol. We will plan to obtain updated baseline labs when he returns for vascular testing as below (CBC, CMET, lipid profile along with additional labs as outlined below).  HTN  - BP is normal. He plans to track his  vitals over the next week following cessation of metoprolol and will send [place] readings for our review via MyChart. Obtain labs when he returns fasting. He is also on a potassium supplement so we'll follow up to see what his K level is.  Dyslipidemia  - will update CMET/lipid panel when he returns for vascular testing. He is not fasting today. He is intolerant to statins and Zetia so will need to consider referral to lipid clinic contingent on f/u LDL level.  Bilateral leg numbness  - also some leg pain, atypical for claudication. I do not suspect significant PAD based on exam but given CAD he is at higher risk for development so will obtain lower extremity vascular screening testing. Will also check B12/folate, Mg, and thyroid with labs when he returns for this study.  Sinus bradycardia  - stop metoprolol and obtain TSH as above. Otherwise will monitor clinically. He will notify for any recurrent dizziness. In general though he feels like he is doing great.  Disposition: F/u with Dr. Radford Pax in 1 year, sooner if needed.  Medication Adjustments/Labs and Tests Ordered: Current medicines are reviewed at length with the patient today.  Concerns regarding medicines are outlined above. Medication changes, Labs and Tests ordered today are summarized above and listed in the Patient Instructions  accessible in Encounters.   Signed, Scott Him, MD  08/19/2021 1:35 PM    [facility] [place], [place], [place]  [address] Phone: [phone]; Fax: [phone]

## 2021-08-20 ENCOUNTER — Encounter: Payer: Self-pay | Admitting: Cardiology

## 2021-08-20 ENCOUNTER — Other Ambulatory Visit: Payer: Self-pay

## 2021-08-20 ENCOUNTER — Ambulatory Visit: Payer: Federal, State, Local not specified - PPO | Admitting: Cardiology

## 2021-08-20 VITALS — BP 110/80 | HR 52 | Ht 67.0 in | Wt 163.6 lb

## 2021-08-20 DIAGNOSIS — E7800 Pure hypercholesterolemia, unspecified: Secondary | ICD-10-CM

## 2021-08-20 DIAGNOSIS — I2510 Atherosclerotic heart disease of native coronary artery without angina pectoris: Secondary | ICD-10-CM | POA: Diagnosis not present

## 2021-08-20 DIAGNOSIS — I10 Essential (primary) hypertension: Secondary | ICD-10-CM

## 2021-08-20 DIAGNOSIS — R200 Anesthesia of skin: Secondary | ICD-10-CM | POA: Diagnosis not present

## 2021-08-20 DIAGNOSIS — R001 Bradycardia, unspecified: Secondary | ICD-10-CM

## 2021-08-20 MED ORDER — NITROGLYCERIN 0.4 MG/SPRAY TL SOLN: 1.0000 | 3 refills | Status: DC | PRN

## 2021-08-20 MED ORDER — ROSUVASTATIN CALCIUM 5 MG PO TABS: 5.0000 mg | ORAL_TABLET | Freq: Every day | ORAL | 3 refills | Status: DC

## 2021-08-20 NOTE — Progress Notes (Signed)
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   Cardiac history is limited in the chart. He had a discharge summary from 2001 that outlined his anatomy, with 75% LAD and mid after diagonal 1, and a 75% ostial diagonal 1. He underwent cutting balloon PTCA of his LAD, reduced from 75% to 10% and balloon PTCA of his ostial diagonal. EF at time of cath was reported to be 40%. Subsequent echocardiogram has demonstrated normalization wtih EF 60-65%, impaired relaxation, normal RV, trivial AI by echo 03/2019. He has previously had edema which improved after cessation of amlodipine. He has history of statin intolerance as well as intolerance to Zetia due to myalgias.   He is here today for followup and is doing well.  He denies any chest pain or pressure, SOB, DOE, PND, orthopnea, LE edema, dizziness, palpitations or syncope. He is compliant with his meds and is tolerating meds with no SE.     Past Medical History:  Diagnosis Date   Asthma    Bradycardia 07/19/2015   CAD (coronary artery disease) 2001   remote anterior STEMI in 2001 s/p cutting balloon angioplasty of the mid LAD and PTCA of ostial diagonal   Claustrophobia 09/22/2013   Diabetes mellitus ([place])    ED (erectile dysfunction)    GERD (gastroesophageal reflux disease)    Glaucoma    Borderline   History of colonoscopy 02/17/2003   Hyperlipidemia    Hypertension    Ischemic  cardiomyopathy    a. EF 40% at time of MI 2001, then normal by f/u echo at that time.   Nephrolithiasis    OSA (obstructive sleep apnea) 09/22/2013   Sinus bradycardia     Past Surgical History:  Procedure Laterality Date   CARDIAC CATHETERIZATION     COLONOSCOPY  02/2003   VASECTOMY      Current Medications: Current Meds  Medication Sig   albuterol (PROVENTIL HFA;VENTOLIN HFA) 108 (90 Base) MCG/ACT inhaler Inhale 2 puffs into the lungs every 6 (six) hours as needed for wheezing or shortness of breath.   ALPRAZolam (XANAX) 0.25 MG tablet Take 0.25 mg by mouth at bedtime as needed for anxiety.   aspirin 81 MG chewable tablet Chew 81 mg by mouth daily.   fluticasone (FLONASE) 50 MCG/ACT nasal spray Place into both nostrils as needed. Use as directed   folic acid (FOLVITE) 1 MG tablet Take 1 mg by mouth daily.   latanoprost (XALATAN) 0.005 % ophthalmic solution INSTILL 1 DROP INTO EACH EYE AT BEDTIME   Misc Natural Products (TART CHERRY ADVANCED PO) Take 1 capsule by mouth daily.   montelukast (SINGULAIR) 10 MG tablet Take 10 mg by mouth at bedtime.   Multiple Vitamin (MULTIVITAMIN) tablet Take 1 tablet by mouth daily.   nitroGLYCERIN (NITROLINGUAL) 0.4 MG/SPRAY spray Place 1 spray under the tongue every 5 (five) minutes x 3 doses as needed for chest pain.   olmesartan-hydrochlorothiazide (BENICAR HCT) 40-12.5 MG  tablet Take 1 tablet by mouth daily.    omeprazole (PRILOSEC) 20 MG capsule Take 20 mg by mouth daily.   potassium chloride SA (KLOR-CON) 20 MEQ tablet Take 1 tablet (20 mEq total) by mouth daily. Please make overdue appt with Dr. Radford Pax before anymore refills. Thank you 1st attempt   rosuvastatin (CRESTOR) 5 MG tablet Take 1 tablet (5 mg total) by mouth daily. Please make overdue appt with Dr. Radford Pax before anymore refills. Thank you 1st attempt   sildenafil (VIAGRA) 100 MG tablet Take 100 mg by mouth daily as needed for erectile dysfunction.    Allergies:   Ivp dye [iodinated  diagnostic agents]   Social History   Socioeconomic History   Marital status: Married    Spouse name: Not on file   Number of children: 3   Years of education: Not on file   Highest education level: Not on file  Occupational History   Occupation: RET    Comment: POSTAL  Tobacco Use   Smoking status: Former    Years: 7.00    Types: Cigarettes   Smokeless tobacco: Never   Tobacco comments:    Pt Quit 1970's  Vaping Use   Vaping Use: Never used  Substance and Sexual Activity   Alcohol use: No   Drug use: No   Sexual activity: Not on file  Other Topics Concern   Not on file  Social History Narrative   Pt married 3 children    1 grandchild   Ret [employer]   Former smoker 1 pack x's 36 yrs    Quit in 1970's   Social Determinants of Radio broadcast assistant Strain: Not on file  Food Insecurity: Not on file  Transportation Needs: Not on file  Physical Activity: Not on file  Stress: Not on file  Social Connections: Not on file     Family History:  The patient's family history includes CAD in his brother; CAD (age of onset: 29) in his father; Leukemia (age of onset: 43) in his mother.  ROS:   Please see the history of present illness.  All other systems are reviewed and otherwise negative.    EKGs/Labs/Other Studies Reviewed:    Studies reviewed are outlined and summarized above. Reports included below if pertinent.  2D Echo 03/2019  1. The left ventricle has normal systolic function with an ejection  fraction of 60-65%. The cavity size was normal. Left ventricular diastolic  Doppler parameters are consistent with impaired relaxation. Indeterminate  filling pressures The E/e' is 8-15.  No evidence of left ventricular regional wall motion abnormalities.   2. The right ventricle has normal systolic function. The cavity was  normal. There is no increase in right ventricular wall thickness.   3. The mitral valve is grossly normal.   4. The tricuspid valve is  grossly normal.   5. The aortic valve is tricuspid. Mild thickening of the aortic valve.  Aortic valve regurgitation is trivial by color flow Doppler. No stenosis  of the aortic valve.   6. The average left ventricular global longitudinal strain is -22.0 %.     EKG:  EKG is ordered today, personally reviewed, demonstrating sinus bradycardia at 52bpm  Recent Labs: 01/23/2021: ALT 14  Recent Lipid Panel    Component Value Date/Time   CHOL 115 01/23/2021 0820   TRIG 164 (H) 01/23/2021 0820   HDL 33 (L) 01/23/2021 0820   CHOLHDL 3.5 01/23/2021 0820   CHOLHDL 4.7 07/16/2016 1119  VLDL 22 07/16/2016 1119   LDLCALC 54 01/23/2021 0820    PHYSICAL EXAM:    VS:  BP 110/80   Pulse (!) 52   Ht '5\' 7"'  (1.702 m)   Wt 163 lb 9.6 oz (74.2 kg)   SpO2 98%   BMI 25.62 kg/m   BMI: Body mass index is 25.62 kg/m. GEN: Well nourished, well developed in no acute distress HEENT: Normal NECK: No JVD; No carotid bruits LYMPHATICS: No lymphadenopathy CARDIAC:RRR, no murmurs, rubs, gallops RESPIRATORY:  Clear to auscultation without rales, wheezing or rhonchi  ABDOMEN: Soft, non-tender, non-distended MUSCULOSKELETAL:  No edema; No deformity  SKIN: Warm and dry NEUROLOGIC:  Alert and oriented x 3 PSYCHIATRIC:  Normal affect   Wt Readings from Last 3 Encounters:  08/20/21 163 lb 9.6 oz (74.2 kg)  09/04/20 174 lb (78.9 kg)  07/02/20 177 lb (80.3 kg)     ASSESSMENT & PLAN:   [place] -Cardiac cath 2001 with 75% LAD and mid after diagonal 1, and a 75% ostial diagonal 1 s/p cutting balloon PTCA of his LAD, reduced from 75% to 10% and balloon PTCA of his ostial diagonal  -he has not had any anginal symptoms since I saw him last -continue prescription drug management with ASA 47m daily -he is statin intolerant -no BB due to soft BP in the past   HTN -BP is well controlled on exam today.   -Continue prescription drug management with olmesartan HCT 40-12.5 mg daily  -check BMET  Dyslipidemia   -LDL goal is less than 70  -He is intolerant to statins and Zetia was referred to lipid clinic and placed back on Crestor 5 mg daily  -His LDL was 54 on 01/23/2021 with normal ALT -Repeat FLP and ALT  Bilateral leg numbness  -also some leg pain, atypical for claudication.  -ABIs were normal on 07/24/2019  Sinus bradycardia  -Resolved but metoprolol   Disposition: F/u in 1 year  Medication Adjustments/Labs and Tests Ordered: Current medicines are reviewed at length with the patient today.  Concerns regarding medicines are outlined above. Medication changes, Labs and Tests ordered today are summarized above and listed in the Patient Instructions accessible in Encounters.   Signed, TFransico Him MD  08/20/2021 3:38 PM    [facility] [address] [place]  [address]Phone: [phone] Fax: [phone]

## 2021-08-20 NOTE — Patient Instructions (Addendum)
Medication Instructions:  Your physician recommends that you continue on your current medications as directed. Please refer to the Current Medication list given to you today.  *If you need a refill on your cardiac medications before your next appointment, please call your pharmacy*   Lab Work: Come back fasting for a lipid panel and ALT If you have labs (blood work) drawn today and your tests are completely normal, you will receive your results only by: MyChart Message (if you have MyChart) OR A paper copy in the mail If you have any lab test that is abnormal or we need to change your treatment, we will call you to review the results.  Follow-Up: At [facility], you and your health needs are our priority.  As part of our continuing mission to provide you with exceptional heart care, we have created designated Provider Care Teams.  These Care Teams include your primary Cardiologist (physician) and Advanced Practice Providers (APPs -  Physician Assistants and Nurse Practitioners) who all work together to provide you with the care you need, when you need it.  Your next appointment:   1 year(s)  The format for your next appointment:   In Person  Provider:   You may see Armanda Magic, MD or one of the following Advanced Practice Providers on your designated Care Team:   Ronie Spies, PA-C Jacolyn Reedy, PA-C

## 2021-08-20 NOTE — Addendum Note (Signed)
Addended by: Theresia Majors on: 08/20/2021 03:47 PM   Modules accepted: Orders

## 2021-08-21 ENCOUNTER — Other Ambulatory Visit: Payer: Federal, State, Local not specified - PPO

## 2021-08-21 DIAGNOSIS — I2510 Atherosclerotic heart disease of native coronary artery without angina pectoris: Secondary | ICD-10-CM

## 2021-08-21 DIAGNOSIS — E7800 Pure hypercholesterolemia, unspecified: Secondary | ICD-10-CM

## 2021-08-21 LAB — LIPID PANEL
Chol/HDL Ratio: 4.2 ratio (ref 0.0–5.0)
Cholesterol, Total: 114 mg/dL (ref 100–199)
HDL: 27 mg/dL — ABNORMAL LOW (ref >39)
LDL Chol Calc (NIH): 45 mg/dL (ref 0–99)
Triglycerides: 269 mg/dL — ABNORMAL HIGH (ref 0–149)
VLDL Cholesterol Cal: 42 mg/dL — ABNORMAL HIGH (ref 5–40)

## 2021-08-21 LAB — ALT: ALT: 11 IU/L (ref 0–44)

## 2021-08-27 ENCOUNTER — Telehealth: Payer: Self-pay | Admitting: Cardiology

## 2021-08-27 DIAGNOSIS — E785 Hyperlipidemia, unspecified: Secondary | ICD-10-CM

## 2021-08-27 DIAGNOSIS — E781 Pure hyperglyceridemia: Secondary | ICD-10-CM

## 2021-08-27 NOTE — Telephone Encounter (Signed)
[phone] (Mobile) Pt says can leave detailed message on this phone number.   Pt called re: his Lipids and he reports that he "thinks" he was fasting when he had his labs drawn... (trigs were elevated).Marland KitchenMarland KitchenI will forward back to Dr. Mayford Knife for her recommendations.   Spoke with the pt re: following a low sugar/ low carb diet.

## 2021-08-27 NOTE — Telephone Encounter (Signed)
LMTCB  Referral placed to the Lipid clinic.

## 2021-08-27 NOTE — Telephone Encounter (Signed)
Patient returned call for lab results.  

## 2021-08-28 NOTE — Telephone Encounter (Signed)
Patient has been scheduled to see PharmD.

## 2021-09-15 NOTE — Progress Notes (Unsigned)
Patient ID: Scott Schwartz                 DOB: 1951-01-25                      MRN: [id number]     HPI: Scott Schwartz is a 70 y.o. male patient referred to lipid clinic by Dr. Radford Pax. PMH is significant for CAD s/p remote anterior STEMI in 2001 s/p cutting balloon angioplasty of the mid LAD and PTCA of ostial diagonal, DM, OSA on CPAP, HTN, HLD, CKD, bradycardia, and GERD. He underwent lower extremity ultrasound on 07/23/20 due to reports of leg numbness at his last visit which was negative for any abnormal blood flow. He has been hesitant to start/increase statins in the past.   Today, *** -LDL at goal, TG not at goal -changes in diet/exercise?  -egfr 60 in 12/20/2020 -repeat labs? Pt didn't know if he was fasting or not?  -has been adherent to rosuva given fill hx -vascepa is tier 2 (no PA required) = $60/month (can you use copay cards with fep?)   Current Medications: rosuvastatin 5 mg daily Intolerances: pravastatin 40 mg daily, ezetimibe 10 mg daily - myalgias Risk Factors: CAD s/p MI, DM, HTN, fam hx of premature CAD LDL goal: '55mg'$ /dL TG goal: <150 mg/dL   Diet:     Exercise:    Family History: CAD in his brother; CAD (age of onset: 43) in his father; Leukemia (age of onset: 15) in his mother.   Social History: Pt is a Theme park manager, formerly worked for the [employer]. Former smoker, quit in the 1970s, denies alcohol and illicit drug use.   Labs: 08/21/21: TC 114, TG 269, HDL 27, LDL 45 (rosuvastatin 5 mg daily) 01/23/21: TC 115, TG 164, HDL 33, LDL 54 (rosuvastatin 5 mg daily, after working on diet) 10/25/20: TC 138, TG 261, HDL 31, LDL 65 (rosuvastatin 5 mg daily) 07/25/20: TC 179, TG 261, HDL 29, LDL 105 (on krill oil)       Past Medical History:  Diagnosis Date   Asthma     Bradycardia 07/19/2015   CAD (coronary artery disease) 2001    remote anterior STEMI in 2001 s/p cutting balloon angioplasty of the mid LAD and PTCA of ostial diagonal   Claustrophobia 09/22/2013   Diabetes  mellitus ([place])     ED (erectile dysfunction)     GERD (gastroesophageal reflux disease)     Glaucoma      Borderline   History of colonoscopy 02/17/2003   Hyperlipidemia     Hypertension     Ischemic cardiomyopathy      a. EF 40% at time of MI 2001, then normal by f/u echo at that time.   Nephrolithiasis     OSA (obstructive sleep apnea) 09/22/2013   Sinus bradycardia              Current Outpatient Medications on File Prior to Visit  Medication Sig Dispense Refill   albuterol (PROVENTIL HFA;VENTOLIN HFA) 108 (90 Base) MCG/ACT inhaler Inhale 2 puffs into the lungs every 6 (six) hours as needed for wheezing or shortness of breath.       ALPRAZolam (XANAX) 0.25 MG tablet Take 0.25 mg by mouth at bedtime as needed for anxiety.       aspirin 81 MG chewable tablet Chew 81 mg by mouth daily.       fluticasone (FLONASE) 50 MCG/ACT nasal spray Place into both nostrils as needed. Use as  directed       folic acid (FOLVITE) 1 MG tablet Take 1 mg by mouth daily.       GARCINIA CAMBOGIA-CHROMIUM PO Take 1 capsule by mouth daily.       Krill Oil 350 MG CAPS Take 1 capsule by mouth daily.       latanoprost (XALATAN) 0.005 % ophthalmic solution INSTILL 1 DROP INTO EACH EYE AT BEDTIME       Misc Natural Products (TART CHERRY ADVANCED PO) Take 1 capsule by mouth daily.       montelukast (SINGULAIR) 10 MG tablet Take 10 mg by mouth at bedtime.       Multiple Vitamin (MULTIVITAMIN) tablet Take 1 tablet by mouth daily.       nitroGLYCERIN (NITROLINGUAL) 0.4 MG/SPRAY spray Place 1 spray under the tongue every 5 (five) minutes x 3 doses as needed for chest pain.       olmesartan-hydrochlorothiazide (BENICAR HCT) 40-12.5 MG tablet Take 1 tablet by mouth daily.        omeprazole (PRILOSEC) 20 MG capsule Take 20 mg by mouth daily.       potassium chloride SA (KLOR-CON) 20 MEQ tablet Take 1 tablet (20 mEq total) by mouth daily. 90 tablet 3   sildenafil (VIAGRA) 100 MG tablet Take 100 mg by mouth daily as needed  for erectile dysfunction.        No current facility-administered medications on file prior to visit.          Allergies  Allergen Reactions   Ivp Dye [Iodinated Diagnostic Agents] Shortness Of Breath      Assessment/Plan:   1. Hyperlipidemia -

## 2021-09-16 ENCOUNTER — Ambulatory Visit: Payer: Federal, State, Local not specified - PPO

## 2022-01-29 ENCOUNTER — Other Ambulatory Visit: Payer: Self-pay | Admitting: Internal Medicine

## 2022-01-29 DIAGNOSIS — R200 Anesthesia of skin: Secondary | ICD-10-CM

## 2022-01-29 DIAGNOSIS — M545 Low back pain, unspecified: Secondary | ICD-10-CM

## 2022-08-19 ENCOUNTER — Other Ambulatory Visit: Payer: Self-pay | Admitting: Cardiology

## 2022-11-12 ENCOUNTER — Encounter: Payer: Self-pay | Admitting: Cardiology

## 2022-11-12 ENCOUNTER — Ambulatory Visit: Payer: Federal, State, Local not specified - PPO | Attending: Cardiology | Admitting: Cardiology

## 2022-11-12 VITALS — BP 106/68 | HR 52 | Ht 67.0 in | Wt 159.0 lb

## 2022-11-12 DIAGNOSIS — R200 Anesthesia of skin: Secondary | ICD-10-CM

## 2022-11-12 DIAGNOSIS — I10 Essential (primary) hypertension: Secondary | ICD-10-CM

## 2022-11-12 DIAGNOSIS — I2510 Atherosclerotic heart disease of native coronary artery without angina pectoris: Secondary | ICD-10-CM | POA: Diagnosis not present

## 2022-11-12 DIAGNOSIS — E785 Hyperlipidemia, unspecified: Secondary | ICD-10-CM | POA: Diagnosis not present

## 2022-11-12 DIAGNOSIS — R001 Bradycardia, unspecified: Secondary | ICD-10-CM

## 2022-11-12 LAB — COMPREHENSIVE METABOLIC PANEL
ALT: 31 IU/L (ref 0–44)
AST: 24 IU/L (ref 0–40)
Albumin/Globulin Ratio: 2.2 (ref 1.2–2.2)
Albumin: 4.7 g/dL (ref 3.9–4.9)
Alkaline Phosphatase: 53 IU/L (ref 44–121)
BUN/Creatinine Ratio: 10 (ref 10–24)
BUN: 15 mg/dL (ref 8–27)
Bilirubin Total: 0.4 mg/dL (ref 0.0–1.2)
CO2: 25 mmol/L (ref 20–29)
Calcium: 9.4 mg/dL (ref 8.6–10.2)
Chloride: 103 mmol/L (ref 96–106)
Creatinine, Ser: 1.46 mg/dL — ABNORMAL HIGH (ref 0.76–1.27)
Globulin, Total: 2.1 g/dL (ref 1.5–4.5)
Glucose: 102 mg/dL — ABNORMAL HIGH (ref 70–99)
Potassium: 4.4 mmol/L (ref 3.5–5.2)
Sodium: 142 mmol/L (ref 134–144)
Total Protein: 6.8 g/dL (ref 6.0–8.5)
eGFR: 51 mL/min/{1.73_m2} — ABNORMAL LOW (ref >59)

## 2022-11-12 LAB — LIPID PANEL
Chol/HDL Ratio: 2.8 ratio (ref 0.0–5.0)
Cholesterol, Total: 92 mg/dL — ABNORMAL LOW (ref 100–199)
HDL: 33 mg/dL — ABNORMAL LOW (ref >39)
LDL Chol Calc (NIH): 41 mg/dL (ref 0–99)
Triglycerides: 94 mg/dL (ref 0–149)
VLDL Cholesterol Cal: 18 mg/dL (ref 5–40)

## 2022-11-12 MED ORDER — OLMESARTAN MEDOXOMIL 40 MG PO TABS: 40.0000 mg | ORAL_TABLET | Freq: Every day | ORAL | 3 refills | Status: DC

## 2022-11-12 NOTE — Progress Notes (Signed)
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   Cardiac history is limited in the chart. He had a discharge summary from 2001 that outlined his anatomy, with 75% LAD and mid after diagonal 1, and a 75% ostial diagonal 1. He underwent cutting balloon PTCA of his LAD, reduced from 75% to 10% and balloon PTCA of his ostial diagonal. EF at time of cath was reported to be 40%. Subsequent echocardiogram has demonstrated normalization wtih EF 60-65%, impaired relaxation, normal RV, trivial AI by echo 03/2019. He has previously had edema which improved after cessation of amlodipine. He has history of statin intolerance as well as intolerance to Zetia due to myalgias but is now on low-dose Crestor.   He is here today for followup and is doing well.  He denies any chest pain or pressure, SOB, DOE, PND, orthopnea, LE edema, palpitations or syncope. Occasionally he feels a little lightheaded. He is compliant with his meds and is tolerating meds with no SE.    Past Medical History:  Diagnosis Date   Asthma    Bradycardia 07/19/2015   CAD (coronary artery disease) 2001   remote anterior STEMI in 2001 s/p cutting balloon angioplasty of the mid LAD and PTCA of ostial diagonal   Claustrophobia 09/22/2013   Diabetes mellitus ([place])    ED (erectile dysfunction)    GERD (gastroesophageal reflux disease)    Glaucoma    Borderline   History of  colonoscopy 02/17/2003   Hyperlipidemia    Hypertension    Ischemic cardiomyopathy    a. EF 40% at time of MI 2001, then normal by f/u echo at that time.   Nephrolithiasis    OSA (obstructive sleep apnea) 09/22/2013   Sinus bradycardia     Past Surgical History:  Procedure Laterality Date   CARDIAC CATHETERIZATION     COLONOSCOPY  02/2003   VASECTOMY      Current Medications: Current Meds  Medication Sig   albuterol (PROVENTIL HFA;VENTOLIN HFA) 108 (90 Base) MCG/ACT inhaler Inhale 2 puffs into the lungs every 6 (six) hours as needed for wheezing or shortness of breath.   ALPRAZolam (XANAX) 0.25 MG tablet Take 0.25 mg by mouth at bedtime as needed for anxiety.   aspirin 81 MG chewable tablet Chew 81 mg by mouth daily.   COVID-19 Specimen Collection KIT See admin instructions. for testing   fluticasone (FLONASE) 50 MCG/ACT nasal spray Place into both nostrils as needed. Use as directed   folic acid (FOLVITE) 1 MG tablet Take 1 mg by mouth daily.   GARCINIA CAMBOGIA-CHROMIUM PO Take 1 capsule by mouth daily.   latanoprost (XALATAN) 0.005 % ophthalmic solution INSTILL 1 DROP INTO EACH EYE AT BEDTIME   Misc Natural Products (TART CHERRY ADVANCED PO) Take 1 capsule by mouth daily.   montelukast (SINGULAIR) 10 MG tablet Take 10 mg by mouth at bedtime.   Multiple Vitamin (MULTIVITAMIN) tablet Take 1 tablet by mouth  daily.   nitroGLYCERIN (NITROLINGUAL) 0.4 MG/SPRAY spray Place 1 spray under the tongue every 5 (five) minutes x 3 doses as needed for chest pain.   olmesartan-hydrochlorothiazide (BENICAR HCT) 40-12.5 MG tablet Take 1 tablet by mouth daily.    omeprazole (PRILOSEC) 20 MG capsule Take 20 mg by mouth daily.   potassium chloride SA (KLOR-CON) 20 MEQ tablet Take 1 tablet (20 mEq total) by mouth daily. Please make overdue appt with Dr. Radford Pax before anymore refills. Thank you 1st attempt   rosuvastatin (CRESTOR) 5 MG tablet Take 1 tablet by mouth once daily   sildenafil (VIAGRA) 100 MG  tablet Take 100 mg by mouth daily as needed for erectile dysfunction.    Allergies:   Ivp dye [iodinated contrast media], Gemfibrozil, Iodine, and Metformin   Social History   Socioeconomic History   Marital status: Married    Spouse name: Not on file   Number of children: 3   Years of education: Not on file   Highest education level: Not on file  Occupational History   Occupation: RET    Comment: POSTAL  Tobacco Use   Smoking status: Former    Years: 7.00    Types: Cigarettes   Smokeless tobacco: Never   Tobacco comments:    Pt Quit 1970's  Vaping Use   Vaping Use: Never used  Substance and Sexual Activity   Alcohol use: No   Drug use: No   Sexual activity: Not on file  Other Topics Concern   Not on file  Social History Narrative   Pt married 3 children    1 grandchild   Ret [employer]   Former smoker 1 pack x's 36 yrs    Quit in 1970's   Social Determinants of Radio broadcast assistant Strain: Not on file  Food Insecurity: Not on file  Transportation Needs: Not on file  Physical Activity: Not on file  Stress: Not on file  Social Connections: Not on file     Family History:  The patient's family history includes CAD in his brother; CAD (age of onset: 44) in his father; Leukemia (age of onset: 34) in his mother.  ROS:   Please see the history of present illness.  All other systems are reviewed and otherwise negative.    EKGs/Labs/Other Studies Reviewed:    Studies reviewed are outlined and summarized above. Reports included below if pertinent.  2D Echo 03/2019  1. The left ventricle has normal systolic function with an ejection  fraction of 60-65%. The cavity size was normal. Left ventricular diastolic  Doppler parameters are consistent with impaired relaxation. Indeterminate  filling pressures The E/e' is 8-15.  No evidence of left ventricular regional wall motion abnormalities.   2. The right ventricle has normal systolic function. The cavity was   normal. There is no increase in right ventricular wall thickness.   3. The mitral valve is grossly normal.   4. The tricuspid valve is grossly normal.   5. The aortic valve is tricuspid. Mild thickening of the aortic valve.  Aortic valve regurgitation is trivial by color flow Doppler. No stenosis  of the aortic valve.   6. The average left ventricular global longitudinal strain is -22.0 %.     EKG:  EKG is ordered today, personally reviewed, demonstrating Sinus bradycardia at 52bpm with no ST changes  Recent Labs: No results found for requested labs within last 365 days.  Recent Lipid Panel    Component Value Date/Time  CHOL 114 08/21/2021 0735   TRIG 269 (H) 08/21/2021 0735   HDL 27 (L) 08/21/2021 0735   CHOLHDL 4.2 08/21/2021 0735   CHOLHDL 4.7 07/16/2016 1119   VLDL 22 07/16/2016 1119   LDLCALC 45 08/21/2021 0735    PHYSICAL EXAM:    VS:  BP 106/68   Pulse (!) 52   Ht 5\' 7"  (1.702 m)   Wt 159 lb (72.1 kg)   BMI 24.90 kg/m   BMI: Body mass index is 24.9 kg/m. GEN: Well nourished, well developed in no acute distress HEENT: Normal NECK: No JVD; No carotid bruits LYMPHATICS: No lymphadenopathy CARDIAC:RRR, no murmurs, rubs, gallops RESPIRATORY:  Clear to auscultation without rales, wheezing or rhonchi  ABDOMEN: Soft, non-tender, non-distended MUSCULOSKELETAL:  No edema; No deformity  SKIN: Warm and dry NEUROLOGIC:  Alert and oriented x 3 PSYCHIATRIC:  Normal affect  Wt Readings from Last 3 Encounters:  11/12/22 159 lb (72.1 kg)  08/20/21 163 lb 9.6 oz (74.2 kg)  09/04/20 174 lb (78.9 kg)     ASSESSMENT & PLAN:   ASCAD -Cardiac cath 2001 with 75% LAD and mid after diagonal 1, and a 75% ostial diagonal 1 s/p cutting balloon PTCA of his LAD, reduced from 75% to 10% and balloon PTCA of his ostial diagonal  -Denies any anginal symptoms since I saw him last -Continue aspirin 81 mg daily -he is statin intolerant -He has not been on beta-blocker therapy in the past  due to soft blood pressure   HTN -BP controlled exam today -he is urinating a lot and his BP is soft so I will stop the HCT part of Olmesartan HC and just continue on Olmesartan 40mg  daily -Check BMP  Dyslipidemia  -LDL goal is less than 70  -He is intolerant to statins and Zetia was referred to lipid clinic and placed back on Crestor 5 mg daily  -Check FLP and ALT -Continue drug management with Crestor 5 mg daily with as needed refills  Bilateral leg numbness  -also some leg pain, atypical for claudication.  -ABIs were normal on 07/24/2019  Sinus bradycardia  -stable off BB  Disposition: F/u in 1 year  Medication Adjustments/Labs and Tests Ordered: Current medicines are reviewed at length with the patient today.  Concerns regarding medicines are outlined above. Medication changes, Labs and Tests ordered today are summarized above and listed in the Patient Instructions accessible in Encounters.   Signed, , MD  11/12/2022 8:49 AM    [facility] [address] [place], [place], [place]  [place] Phone: [phone]; Fax: [phone]

## 2022-11-12 NOTE — Patient Instructions (Signed)
Medication Instructions:  Your physician has recommended you make the following change in your medication:   ** Stop Olmesartan Hctz 40-12.5mg   ** Start Olmesartan 40mg  - 1 tablet by mouth daily  *If you need a refill on your cardiac medications before your next appointment, please call your pharmacy*   Lab Work: CMET and FLP today  If you have labs (blood work) drawn today and your tests are completely normal, you will receive your results only by: Wanda (if you have MyChart) OR A paper copy in the mail If you have any lab test that is abnormal or we need to change your treatment, we will call you to review the results.   Testing/Procedures: None ordered.    Follow-Up: At [facility], you and your health needs are our priority.  As part of our continuing mission to provide you with exceptional heart care, we have created designated Provider Care Teams.  These Care Teams include your primary Cardiologist (physician) and Advanced Practice Providers (APPs -  Physician Assistants and Nurse Practitioners) who all work together to provide you with the care you need, when you need it.  We recommend signing up for the patient portal called "MyChart".  Sign up information is provided on this After Visit Summary.  MyChart is used to connect with patients for Virtual Visits (Telemedicine).  Patients are able to view lab/test results, encounter notes, upcoming appointments, etc.  Non-urgent messages can be sent to your provider as well.   To learn more about what you can do with MyChart, go to [email or web address].    Your next appointment:   12 months with Dr Radford Pax

## 2022-11-18 ENCOUNTER — Other Ambulatory Visit: Payer: Self-pay | Admitting: Cardiology

## 2022-12-11 ENCOUNTER — Telehealth: Payer: Self-pay | Admitting: Cardiology

## 2022-12-11 NOTE — Telephone Encounter (Signed)
Pt c/o swelling: STAT is pt has developed SOB within 24 hours  How much weight have you gained and in what time span?  Patient states he actually lost weight. He is normally about 170 lbs, but since the first of the year he has remained around 160 lbs.  If swelling, where is the swelling located?  Feet + lower legs have been swelling all week  Are you currently taking a fluid pill?  No, but patient mentions Olmesartan recently being discontinued. He assumes this may be a factor.  Are you currently SOB?  No  Do you have a log of your daily weights (if so, list)?   Have you gained 3 pounds in a day or 5 pounds in a week?   Have you traveled recently?  No

## 2022-12-11 NOTE — Telephone Encounter (Signed)
Spoke with patient who called in earlier today concerned about new swelling in his lower legs. He clarifies he has swelling in his toes and the top of his feet, there is no swelling past his ankles. He states that he was on an olmesartan and hydrochlorothiazide previously, but Dr. Radford Pax took him off his hydrochlorothiazide on 11/12/22 as he felt it was making him urinate too much. At that time, patient's Creatinine was 1.46.  Patient denies any SOB or weight gain and states his blood pressures at home have been "normal".  He does admit to eating more salt than usual because he had Covid recently and it affected his sense of taste and smell. Advised patient to cut back on salt and elevate legs over the weekend. Also advised patient to weight himself daily and if he notices 3 lb weight gain overnight, especially if accompanied by SOB he needs to go to ER. Patient verbalizes understanding. Forwarded to Dr. Radford Pax.

## 2022-12-16 NOTE — Telephone Encounter (Signed)
Instructed patient to track weights, limit salt, elevate legs until his visit on 12/29/22. Also advised patient to call 911 if he starts to experiencing worsening swelling and SOB.

## 2022-12-16 NOTE — Telephone Encounter (Signed)
Patient is returning call, requesting that his call be returned to his cell phone at [phone]. He mentions that his swelling has gone down but he still has swelling in his ankles.

## 2022-12-16 NOTE — Telephone Encounter (Signed)
Called patient to check on leg swelling, no answer, left message per DPR.

## 2022-12-16 NOTE — Telephone Encounter (Signed)
Patient states swelling is improved by limiting salt and elevating legs, requests appointment as he feels he needs to get back on diuretics. Appt made for 12/29/22. Patient continues to deny SOB.

## 2022-12-29 ENCOUNTER — Ambulatory Visit: Payer: Federal, State, Local not specified - PPO | Admitting: Physician Assistant

## 2023-01-16 ENCOUNTER — Emergency Department (HOSPITAL_BASED_OUTPATIENT_CLINIC_OR_DEPARTMENT_OTHER): Payer: Federal, State, Local not specified - PPO | Admitting: Radiology

## 2023-01-16 ENCOUNTER — Emergency Department (HOSPITAL_BASED_OUTPATIENT_CLINIC_OR_DEPARTMENT_OTHER)
Admission: EM | Admit: 2023-01-16 | Discharge: 2023-01-16 | Disposition: A | Discharge status: Home / Self Care | Payer: Federal, State, Local not specified - PPO | Attending: Emergency Medicine | Admitting: Emergency Medicine

## 2023-01-16 ENCOUNTER — Encounter (HOSPITAL_BASED_OUTPATIENT_CLINIC_OR_DEPARTMENT_OTHER): Payer: Self-pay

## 2023-01-16 ENCOUNTER — Emergency Department (HOSPITAL_BASED_OUTPATIENT_CLINIC_OR_DEPARTMENT_OTHER): Payer: Federal, State, Local not specified - PPO

## 2023-01-16 DIAGNOSIS — J45909 Unspecified asthma, uncomplicated: Secondary | ICD-10-CM | POA: Insufficient documentation

## 2023-01-16 DIAGNOSIS — E119 Type 2 diabetes mellitus without complications: Secondary | ICD-10-CM | POA: Insufficient documentation

## 2023-01-16 DIAGNOSIS — I2510 Atherosclerotic heart disease of native coronary artery without angina pectoris: Secondary | ICD-10-CM | POA: Diagnosis not present

## 2023-01-16 DIAGNOSIS — R5383 Other fatigue: Secondary | ICD-10-CM | POA: Diagnosis not present

## 2023-01-16 DIAGNOSIS — R600 Localized edema: Secondary | ICD-10-CM | POA: Insufficient documentation

## 2023-01-16 DIAGNOSIS — Z79899 Other long term (current) drug therapy: Secondary | ICD-10-CM | POA: Insufficient documentation

## 2023-01-16 DIAGNOSIS — Z7982 Long term (current) use of aspirin: Secondary | ICD-10-CM | POA: Diagnosis not present

## 2023-01-16 DIAGNOSIS — Z951 Presence of aortocoronary bypass graft: Secondary | ICD-10-CM | POA: Insufficient documentation

## 2023-01-16 DIAGNOSIS — I10 Essential (primary) hypertension: Secondary | ICD-10-CM | POA: Insufficient documentation

## 2023-01-16 DIAGNOSIS — R42 Dizziness and giddiness: Secondary | ICD-10-CM | POA: Diagnosis present

## 2023-01-16 LAB — CBC WITH DIFFERENTIAL/PLATELET
Abs Immature Granulocytes: 0.02 10*3/uL (ref 0.00–0.07)
Basophils Absolute: 0.1 10*3/uL (ref 0.0–0.1)
Basophils Relative: 1 %
Eosinophils Absolute: 0.1 10*3/uL (ref 0.0–0.5)
Eosinophils Relative: 1 %
HCT: 44.3 % (ref 39.0–52.0)
Hemoglobin: 15.6 g/dL (ref 13.0–17.0)
Immature Granulocytes: 0 %
Lymphocytes Relative: 23 %
Lymphs Abs: 2.1 10*3/uL (ref 0.7–4.0)
MCH: 32.1 pg (ref 26.0–34.0)
MCHC: 35.2 g/dL (ref 30.0–36.0)
MCV: 91.2 fL (ref 80.0–100.0)
Monocytes Absolute: 0.5 10*3/uL (ref 0.1–1.0)
Monocytes Relative: 6 %
Neutro Abs: 6.4 10*3/uL (ref 1.7–7.7)
Neutrophils Relative %: 69 %
Platelets: 227 10*3/uL (ref 150–400)
RBC: 4.86 MIL/uL (ref 4.22–5.81)
RDW: 13.6 % (ref 11.5–15.5)
WBC: 9.2 10*3/uL (ref 4.0–10.5)
nRBC: 0 % (ref 0.0–0.2)

## 2023-01-16 LAB — URINALYSIS, ROUTINE W REFLEX MICROSCOPIC
Bilirubin Urine: NEGATIVE
Glucose, UA: NEGATIVE mg/dL
Hgb urine dipstick: NEGATIVE
Ketones, ur: NEGATIVE mg/dL
Leukocytes,Ua: NEGATIVE
Nitrite: NEGATIVE
Protein, ur: NEGATIVE mg/dL
Specific Gravity, Urine: <1.005 — ABNORMAL LOW (ref 1.005–1.030)
pH: 5.5 (ref 5.0–8.0)

## 2023-01-16 LAB — COMPREHENSIVE METABOLIC PANEL
ALT: 12 U/L (ref 0–44)
AST: 17 U/L (ref 15–41)
Albumin: 4.8 g/dL (ref 3.5–5.0)
Alkaline Phosphatase: 40 U/L (ref 38–126)
Anion gap: 11 (ref 5–15)
BUN: 15 mg/dL (ref 8–23)
CO2: 24 mmol/L (ref 22–32)
Calcium: 9.6 mg/dL (ref 8.9–10.3)
Chloride: 107 mmol/L (ref 98–111)
Creatinine, Ser: 1.24 mg/dL (ref 0.61–1.24)
GFR, Estimated: >60 mL/min (ref >60)
Glucose, Bld: 120 mg/dL — ABNORMAL HIGH (ref 70–99)
Potassium: 3.7 mmol/L (ref 3.5–5.1)
Sodium: 142 mmol/L (ref 135–145)
Total Bilirubin: 0.5 mg/dL (ref 0.3–1.2)
Total Protein: 7.4 g/dL (ref 6.5–8.1)

## 2023-01-16 LAB — TROPONIN I (HIGH SENSITIVITY)
Troponin I (High Sensitivity): 5 ng/L (ref <18)
Troponin I (High Sensitivity): 6 ng/L (ref <18)

## 2023-01-16 LAB — BRAIN NATRIURETIC PEPTIDE: B Natriuretic Peptide: 68.7 pg/mL (ref 0.0–100.0)

## 2023-01-16 MED ORDER — AMLODIPINE BESYLATE 5 MG PO TABS
5.0000 mg | ORAL_TABLET | Freq: Once | ORAL | Status: AC
  Administered 2023-01-16: 5 mg via ORAL
  Filled 2023-01-16: qty 1

## 2023-01-16 MED ORDER — AMLODIPINE BESYLATE 5 MG PO TABS: 5.0000 mg | ORAL_TABLET | Freq: Every day | ORAL | 1 refills | Status: DC

## 2023-01-16 NOTE — ED Provider Notes (Signed)
[facility] Provider Note   CSN: [id number] Arrival date & time: 01/16/23  1010     History  Chief Complaint  Patient presents with   Dizziness    Scott Schwartz is a 72 y.o. male.  With PMH of CAD status post CABG, HTN, HLD, asthma, DM who presents mainly with concerns of elevated blood pressure although triage note said dizziness his main complaint is hypertension.  However, he is currently asymptomatic.  He has had no chest pain, no shortness of breath, no focal weakness numbness or tingling, no slurred speech.  He just endorses some mild fatigue.  He has also had intermittent headaches.  He was previously on olmesartan-HCTZ however had the HCTZ component removed because he was peeing too often.  Since then his blood pressures have been increasing and he was concerned.  He also notes being on multiple previous blood pressure regimen medicines but has been taken off over the years.   Dizziness      Home Medications Prior to Admission medications   Medication Sig Start Date End Date Taking? Authorizing Provider  amLODipine (NORVASC) 5 MG tablet Take 1 tablet (5 mg total) by mouth daily. 01/16/23  Yes Elgie Congo, MD  albuterol (PROVENTIL HFA;VENTOLIN HFA) 108 (90 Base) MCG/ACT inhaler Inhale 2 puffs into the lungs every 6 (six) hours as needed for wheezing or shortness of breath.    [provider]  ALPRAZolam Duanne Moron) 0.25 MG tablet Take 0.25 mg by mouth at bedtime as needed for anxiety.    [provider]  aspirin 81 MG chewable tablet Chew 81 mg by mouth daily.    [provider]  COVID-19 Specimen Collection KIT See admin instructions. for testing 07/18/20   [provider]  fluticasone (FLONASE) 50 MCG/ACT nasal spray Place into both nostrils as needed. Use as directed    [provider]  folic acid (FOLVITE) 1 MG tablet Take 1 mg by mouth daily.    [provider]  GARCINIA  CAMBOGIA-CHROMIUM PO Take 1 capsule by mouth daily.    [provider]  latanoprost (XALATAN) 0.005 % ophthalmic solution INSTILL 1 DROP INTO EACH EYE AT BEDTIME 04/28/19   [provider]  Misc Natural Products (TART CHERRY ADVANCED PO) Take 1 capsule by mouth daily.    [provider]  montelukast (SINGULAIR) 10 MG tablet Take 10 mg by mouth at bedtime.    [provider]  Multiple Vitamin (MULTIVITAMIN) tablet Take 1 tablet by mouth daily.    [provider]  nitroGLYCERIN (NITROLINGUAL) 0.4 MG/SPRAY spray Place 1 spray under the tongue every 5 (five) minutes x 3 doses as needed for chest pain. 08/20/21   Sueanne Margarita, MD  olmesartan (BENICAR) 40 MG tablet Take 1 tablet (40 mg total) by mouth daily. 11/12/22   Sueanne Margarita, MD  omeprazole (PRILOSEC) 20 MG capsule Take 20 mg by mouth daily.    [provider]  potassium chloride SA (KLOR-CON) 20 MEQ tablet Take 1 tablet (20 mEq total) by mouth daily. Please make overdue appt with Dr. Radford Pax before anymore refills. Thank you 1st attempt 07/23/21   Sueanne Margarita, MD  rosuvastatin (CRESTOR) 5 MG tablet Take 1 tablet by mouth once daily 11/18/22   Sueanne Margarita, MD  sildenafil (VIAGRA) 100 MG tablet Take 100 mg by mouth daily as needed for erectile dysfunction.    [provider]      Allergies  Ivp dye [iodinated contrast media], Gemfibrozil, Iodine, and Metformin    Review of Systems   Review of Systems  Neurological:  Positive for dizziness.    Physical Exam Updated Vital Signs BP (!) 169/89 (BP Location: Right Arm)   Pulse (!) 55   Temp 98 F (36.7 C)   Resp 16   SpO2 100%  Physical Exam Constitutional: Alert and oriented. Pleasant male Well appearing and in no distress. Eyes: Conjunctivae are normal. ENT      Head: Normocephalic and atraumatic. Cardiovascular: S1, S2,  bradycardic, Normal and symmetric distal pulses are present in all extremities.Warm and well  perfused. Respiratory: Normal respiratory effort. Breath sounds are normal. O2 sat 100 on RA Gastrointestinal: nondistended Musculoskeletal: Normal range of motion in all extremities.      Right lower leg: No tenderness or edema.      Left lower leg: No tenderness or edema. Neurologic: Normal speech and language. Cn 2-12 grossly intact. Steady gait, normal finger to nose, 5/5 strength x 4 extremities. Sensation grossly intact. No gross focal neurologic deficits are appreciated. Skin: Skin is warm, dry and intact. No rash noted. Psychiatric: Mood and affect are normal. Speech and behavior are normal.  ED Results / Procedures / Treatments   Labs (all labs ordered are listed, but only abnormal results are displayed) Labs Reviewed  COMPREHENSIVE METABOLIC PANEL - Abnormal; Notable for the following components:      Result Value   Glucose, Bld 120 (*)    All other components within normal limits  URINALYSIS, ROUTINE W REFLEX MICROSCOPIC - Abnormal; Notable for the following components:   Color, Urine COLORLESS (*)    Specific Gravity, Urine <1.005 (*)    All other components within normal limits  CBC WITH DIFFERENTIAL/PLATELET  BRAIN NATRIURETIC PEPTIDE  TROPONIN I (HIGH SENSITIVITY)  TROPONIN I (HIGH SENSITIVITY)    EKG EKG Interpretation  Date/Time:  Saturday January 16 2023 10:20:31 EDT Ventricular Rate:  58 PR Interval:  160 QRS Duration: 74 QT Interval:  402 QTC Calculation: 394 R Axis:   26 Text Interpretation: Sinus bradycardia Otherwise normal ECG Nonspecific inferior T wave inversions improved from prio When compared with ECG of 24-Apr-2001 07:00, Criteria for Inferior infarct are no longer Present ST no longer elevated in Lateral leads Confirmed by Georgina Snell [phone]) on 01/16/2023 10:43:21 AM  Radiology CT Head Wo Contrast  Result Date: 01/16/2023 CLINICAL DATA:  Dizziness EXAM: CT HEAD WITHOUT CONTRAST TECHNIQUE: Contiguous axial images were obtained from the base  of the skull through the vertex without intravenous contrast. RADIATION DOSE REDUCTION: This exam was performed according to the departmental dose-optimization program which includes automated exposure control, adjustment of the mA and/or kV according to patient size and/or use of iterative reconstruction technique. COMPARISON:  08/11/2017 FINDINGS: Brain: No evidence of acute infarction, hemorrhage, hydrocephalus, extra-axial collection or mass lesion/mass effect. Periventricular and deep white matter hypodensity. Vascular: No hyperdense vessel or unexpected calcification. Skull: Normal. Negative for fracture or focal lesion. Sinuses/Orbits: No acute finding. Other: None. IMPRESSION: No acute intracranial pathology. Small-vessel white matter disease. Electronically Signed   By: Delanna Ahmadi M.D.   On: 01/16/2023 11:30   DG Chest 2 View  Result Date: 01/16/2023 CLINICAL DATA:  feeling unwell/h/o cad EXAM: CHEST - 2 VIEW COMPARISON:  04/25/2001 by report only FINDINGS: Lungs are clear. Heart size and mediastinal contours are within normal limits. No effusion. Mild thoracic levoscoliosis. Vertebral endplate spurring at multiple levels in the mid thoracic spine. IMPRESSION: No acute  cardiopulmonary disease. Electronically Signed   By: Lucrezia Europe M.D.   On: 01/16/2023 11:18    Procedures Procedures    Medications Ordered in ED Medications  amLODipine (NORVASC) tablet 5 mg (5 mg Oral Given 01/16/23 1150)    ED Course/ Medical Decision Making/ A&P   {    Medical Decision Making Scott Schwartz is a 72 y.o. male.  With PMH of CAD status post CABG, HTN, HLD, asthma, DM who presents mainly with concerns of elevated blood pressure although triage note said dizziness his main complaint is hypertension.  However, he is currently asymptomatic.   Regarding HTN: No evidence of progressive target organ dysfunction to suggest hypertensive emergency and acute lowering of blood pressure at this point.   Specifically,   - No evidence of acute myocardial ischemia based on EKG, troponin (5, 6), absent chest pain - No evidence of acute renal failure based on reassuring Cr 1.24 wnl - No evidence of pulmonary edema based on pulmonary exam  and chest XR personally reviewed by me - No evidence of encephalopathy based on normal and stable mental status - No evidence for [facility] [place], ICH, or other CVA based on normal neurologic exam and normal head CT - No evidence for aortic dissection based absent chest pain, normal EKG and CXR, symmetric radial pulses  Will manage as asymptomatic hypertensive urgency.  Will initiate anti-hypertensive medication amlodipine in addition to olmesartan on discharge.  Discussed critical importance of follow up with PCP within 1 week and increased risk of devastating stroke, heart attack, respiratory distress, and other life threatening complications if blood pressure is not reduced appropriately.       Amount and/or Complexity of Data Reviewed Labs: ordered. Radiology: ordered.  Risk Prescription drug management.    Final Clinical Impression(s) / ED Diagnoses Final diagnoses:  Hypertension, unspecified type  Other fatigue    Rx / DC Orders ED Discharge Orders          Ordered    amLODipine (NORVASC) 5 MG tablet  Daily        01/16/23 1403              Elgie Congo, MD 01/16/23 1719

## 2023-01-16 NOTE — Discharge Instructions (Addendum)
You were seen in the emergency department today for elevated blood pressure.  Your exam/workup today does not appear to show any abnormalities that would require admission. It is very important that you take all of your medications as prescribed, every day.   Please follow up with your doctor in the next 2-3 days to discuss your elevated blood pressures and your current medication regimen.  I have added on amlodipine to your blood pressure regimen however this can lead to lower extremity swelling as the most common side effect.  Make an appointment to discuss this blood pressure regimen with your doctor soon as possible and if they would like you to continue this medicine or switch to something different.  Return to the emergency department if you develop any sudden/severe headache, confusion, slurred speech, worsening vision, weakness or numbness of any arm or leg, or for any chest pain, pressure, or trouble breathing.

## 2023-01-16 NOTE — ED Triage Notes (Signed)
He c/o "feeling kinda dizzy and lightheaded this morning" [sic]. He is alert and in no distress. He tells me he has hx of m.i and denies any chest discomfort now.

## 2023-01-16 NOTE — ED Notes (Signed)
Patient transported to X-ray 

## 2023-01-18 ENCOUNTER — Telehealth: Payer: Self-pay | Admitting: Cardiology

## 2023-01-18 NOTE — Telephone Encounter (Signed)
Spoke with pt who was wanting to review his medications to insure he is taking his medication accurately.  Pt reports he is taking olmesartan 40 mg as ordered and has been taking the amlodipine 5 mg that was ordered after his recent ED visit.  He reports he is feeling better. Denies any dizziness/light-headedness, blurred vision etc..  Reports he does have a slight dull h/a on the right side of his head from front to back - but is very dull. He has not been checking his BP at home.  Advised to start tracking his BP and keeping a log of it.  Advised to take after medications, at least 1 & 1/2  to 2 hrs and to sit for 5 minutes before.  Reviewed low NA+ diet as well.  Pt states understanding, was appreciative of the call and information.  He will c/b with any further questions/concerns.

## 2023-01-18 NOTE — Telephone Encounter (Signed)
Pt c/o medication issue:  1. Name of Medication: amLODipine (NORVASC) 5 MG tablet   2. How are you currently taking this medication (dosage and times per day)? As prescribed  3. Are you having a reaction (difficulty breathing--STAT)? No  4. What is your medication issue? Pt wanted to make Dr. Radford Pax aware that he had to go to the ER this weekend. He was having real bad headaches. He stats they kept him for a few hours. He states that he is feeling much better but can still feel a little bit of the nagging headache. He thinks it might've been because he wasn't taking all of his medications. He states he was not taking his amlodipine. He would like someone to call to go over his medications to ensure he is taking everything that he should. He states that the hospital had given him amlodipine intravenously and that is when he started to feel better. He states that the hospital had called amlodipine in for him when he left the hospital Saturday, and states he has been taking it ever since, along with his losartan and asprin.

## 2023-01-19 ENCOUNTER — Ambulatory Visit: Payer: Federal, State, Local not specified - PPO | Admitting: Physician Assistant

## 2023-01-19 NOTE — Telephone Encounter (Signed)
Called to check on patient and share Dr. Theodosia Blender advice that he see his PCP if headaches continue. Patient states he is back on the amlodipine and his headaches are much improved. He denies any headache today. He also states that he has been checking his blood pressures but has not been writing them down so he is unable to share any recent BP's with me. Advised patient to keep a BP log and to call our office in one week with BP log. Also advised patient that if headaches recur to see his PCP. Patient verbalizes understanding and agrees to plan.

## 2023-03-10 ENCOUNTER — Encounter: Payer: Self-pay | Admitting: Cardiology

## 2023-03-10 ENCOUNTER — Ambulatory Visit: Payer: Federal, State, Local not specified - PPO | Attending: Physician Assistant | Admitting: Cardiology

## 2023-03-10 VITALS — BP 110/60 | HR 59 | Ht 67.0 in | Wt 156.6 lb

## 2023-03-10 DIAGNOSIS — I2510 Atherosclerotic heart disease of native coronary artery without angina pectoris: Secondary | ICD-10-CM

## 2023-03-10 DIAGNOSIS — R001 Bradycardia, unspecified: Secondary | ICD-10-CM

## 2023-03-10 DIAGNOSIS — E785 Hyperlipidemia, unspecified: Secondary | ICD-10-CM

## 2023-03-10 DIAGNOSIS — I10 Essential (primary) hypertension: Secondary | ICD-10-CM | POA: Diagnosis not present

## 2023-03-10 NOTE — Patient Instructions (Signed)
Medication Instructions:  Your physician recommends that you continue on your current medications as directed. Please refer to the Current Medication list given to you today.  *If you need a refill on your cardiac medications before your next appointment, please call your pharmacy*   Lab Work: None ordered  If you have labs (blood work) drawn today and your tests are completely normal, you will receive your results only by: MyChart Message (if you have MyChart) OR A paper copy in the mail If you have any lab test that is abnormal or we need to change your treatment, we will call you to review the results.   Testing/Procedures: None ordered   Follow-Up: At [facility], you and your health needs are our priority.  As part of our continuing mission to provide you with exceptional heart care, we have created designated Provider Care Teams.  These Care Teams include your primary Cardiologist (physician) and Advanced Practice Providers (APPs -  Physician Assistants and Nurse Practitioners) who all work together to provide you with the care you need, when you need it.  We recommend signing up for the patient portal called "MyChart".  Sign up information is provided on this After Visit Summary.  MyChart is used to connect with patients for Virtual Visits (Telemedicine).  Patients are able to view lab/test results, encounter notes, upcoming appointments, etc.  Non-urgent messages can be sent to your provider as well.   To learn more about what you can do with MyChart, go to [email or web address].    Your next appointment:   1 year(s)  Provider:   Traci Turner, MD     Other Instructions   

## 2023-03-10 NOTE — Assessment & Plan Note (Signed)
Patient with fluctuant BP over the past few months. HCTZ discontinued at January cardiology OV with soft BP (though appears patient continued to take). More recently hypertensive both at PCP and ED and he was placed on Amlodipine 5mg  along with Metoprolol Succinate 50mg . BP in clinic today 110/60 mmHg. Given patient's concern of frequent urination and at goal BP, we will again plan to stop HCTZ 12.5mg  and continue with Olmesartan 40mg  along with Metoprolol Succinate 50mg  and Amlodipine 5mg . Patient has been instructed to keep track of blood pressure twice daily over the next week with this change.

## 2023-03-10 NOTE — Assessment & Plan Note (Signed)
Patient with hx MI 2001 with cutting balloon angioplasty of mid LAD and PTCA of ostial diag. Also s/p CABG 09/2013. No anginal symptoms, dyspnea, or exertional limitations. Will continue Crestor 5mg , ASA 81mg  daily.

## 2023-03-10 NOTE — Assessment & Plan Note (Signed)
Patient with HR in the 50s on exam today. Suspect this is secondary to recent increase in Metoprolol dosing. Since BP is at goal and he is asymptomatic, will continue to monitor rate/symptoms.

## 2023-03-10 NOTE — Progress Notes (Signed)
Cardiology Office Note:    Date:  03/10/2023   ID:  Scott Schwartz, DOB 07/25/1951, MRN [id number]  PCP:  Garlan Fillers, MD  [facility] Providers Cardiologist:  Armanda Magic, MD    Referring MD: Garlan Fillers, MD   Chief Complaint:  No chief complaint on file.    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 Exam Location:  [address]   Procedure: 2D Echo, Cardiac Doppler, Color Doppler and Strain Analysis  Indications:    I25.5 Ischemic cardiomyopathy  History:        Patient has no prior history of Echocardiogram examinations. Cardiomyopathy CAD Bradycardia Risk Factors: Hypertension, Sleep Apnea, Diabetes, Edema., Dyslipidemia and Former Smoker.  Sonographer:    Jorje Guild RDCS Referring Phys: [phone] JILL D MCDANIEL  IMPRESSIONS   1. The left ventricle has normal systolic function with an ejection fraction of 60-65%. The cavity size was normal. Left ventricular diastolic Doppler parameters are consistent with impaired relaxation. Indeterminate filling pressures The E/e' is 8-15. No evidence of left ventricular regional wall motion abnormalities. 2. The right ventricle has normal systolic function. The cavity was normal. There is no increase in right ventricular wall thickness. 3. The  mitral valve is grossly normal. 4. The tricuspid valve is grossly normal. 5. The aortic valve is tricuspid. Mild thickening of the aortic valve. Aortic valve regurgitation is trivial by color flow Doppler. No stenosis of the aortic valve. 6. The average left ventricular global longitudinal strain is -22.0 %.  SUMMARY  LVEF 60-65%, normal wall thickness and motion, grade 1 DD, indeterminate LV filling pressure, trileaflet sclerotic aortic valve with trivial AI, normal GLS at -22%, normal IVC FINDINGS Left Ventricle: The left ventricle has normal systolic function, with an ejection fraction of 60-65%. The cavity size was normal. There is no increase in left ventricular wall thickness. Left ventricular diastolic Doppler parameters are consistent with impaired relaxation. Indeterminate filling pressures The E/e' is 8-15. No evidence of left ventricular regional wall motion abnormalities.. The average left ventricular global longitudinal strain is -22.0 %.  Right Ventricle: The right ventricle has normal systolic function. The cavity was normal. There is no increase in right ventricular wall thickness.  Left Atrium: Left atrial size was normal in size.  Right Atrium: Right atrial size was normal in size. Right atrial pressure is estimated at 3 mmHg.  Interatrial Septum: No atrial level shunt detected  by color flow Doppler.  Pericardium: There is no evidence of pericardial effusion.  Mitral Valve: The mitral valve is grossly normal. Mitral valve regurgitation is trivial by color flow Doppler.  Tricuspid Valve: The tricuspid valve is grossly normal. Tricuspid valve regurgitation was not visualized by color flow Doppler.  Aortic Valve: The aortic valve is tricuspid Mild thickening of the aortic valve. Aortic valve regurgitation is trivial by color flow Doppler. There is No stenosis of the aortic valve.  Pulmonic Valve: The pulmonic valve was grossly normal. Pulmonic valve regurgitation is not  visualized by color flow Doppler.  Venous: The inferior vena cava is normal in size with greater than 50% respiratory variability.   +--------------+--------++ LEFT VENTRICLE         +----------------+---------++ +--------------+--------++ Diastology                PLAX 2D                +----------------+---------++ +--------------+--------++ LV e' lateral:  8.18 cm/s LVIDd:        4.40 cm  +----------------+---------++ +--------------+--------++ LV E/e' lateral:8.4       LVIDs:        2.90 cm  +----------------+---------++ +--------------+--------++ LV e' medial:   5.63 cm/s LV PW:        1.00 cm  +----------------+---------++ +--------------+--------++ LV E/e' medial: 12.2      LV IVS:       0.90 cm  +----------------+---------++ +--------------+--------++ LVOT diam:    2.00 cm  +----------------------+-------++ +--------------+--------++ 2D Longitudinal Strain        LV SV:        55 ml    +----------------------+-------++ +--------------+--------++ 2D Strain GLS (A2C):  -21.5 % LV SV Index:  28.65    +----------------------+-------++ +--------------+--------++ 2D Strain GLS (A3C):  -21.8 % LVOT Area:    3.14 cm +----------------------+-------++ +--------------+--------++ 2D Strain GLS (A4C):  -22.6 %                        +----------------------+-------++ +--------------+--------++ 2D Strain GLS Avg:    -22.0 % +----------------------+-------++  +---------------+----------++ RIGHT VENTRICLE           +---------------+----------++ RV S prime:    16.10 cm/s +---------------+----------++ TAPSE (M-mode):3.0 cm     +---------------+----------++  +---------------+-------++-----------++ LEFT ATRIUM           Index       +---------------+-------++-----------++ LA diam:       3.80 cm2.02 cm/m  +---------------+-------++-----------++ LA Vol (A2C):  24.1 ml12.79  ml/m +---------------+-------++-----------++ LA Vol (A4C):  24.9 ml13.21 ml/m +---------------+-------++-----------++ LA Biplane Vol:25.8 ml13.69 ml/m +---------------+-------++-----------++ +------------+---------++-----------++ RIGHT ATRIUM         Index       +------------+---------++-----------++ RA Pressure:3.00 mmHg            +------------+---------++-----------++ RA Area:    14.70 cm            +------------+---------++-----------++ RA Volume:  35.20 ml 18.67 ml/m +------------+---------++-----------++ +------------+-----------++ AORTIC VALVE            +------------+-----------++ LVOT Vmax:  108.00 cm/s +------------+-----------++ LVOT Vmean: 68.400 cm/s +------------+-----------++ LVOT VTI:   0.255 m     +------------+-----------++  +-------------+-------++ AORTA                +-------------+-------++ Ao Root diam:3.10 cm +-------------+-------++ Ao Asc diam: 3.40 cm +-------------+-------++  +--------------+--------++   +---------------+---------++ MITRAL VALVE             TRICUSPID  VALVE          +--------------+--------++   +---------------+---------++                          Estimated RAP: 3.00 mmHg +--------------+--------++   +---------------+---------++                        +--------------+--------++   +--------------+-------+ MV Decel Time:243 msec   SHUNTS                +--------------+--------++   +--------------+-------+ +--------------+----------++ Systemic VTI: 0.26 m  MV E velocity:68.90 cm/s +--------------+-------+ +--------------+----------++ Systemic Diam:2.00 cm MV A velocity:76.60 cm/s +--------------+-------+ +--------------+----------++ MV E/A ratio: 0.90       +--------------+----------++   Zoila Shutter MD Electronically signed by Zoila Shutter MD Signature Date/Time: 04/14/2019/4:35:01 PM    Final               History of Present Illness:   Scott Schwartz is a 72 y.o. male with the above problem list.  He presents today for follow up after recent ED visit and to discuss increased lower extremity edema. Patient with history of reduced LVEF at time of 2001 STEMI, reportedly around 40%. Subsequent imaging showed recovery of EF to 60-65%. Patient previously with lower extremity edema that improved after stopping amlodipine. [facility] on 11/12/22. At that time, BP actually noted to be soft so patient was discontinued from HCTZ, continued on Olmesartan 40mg  only. Patient subsequently saw PA at PCP office on 01/05/23 and found to be hypertensive, 180/80. Appears that Metoprolol Succinate increased to 50mg  daily (per Rx bottle that patient has in office today). He then also presented to the ED on 3/30 with concern of hypertension, BP measured 169/89. Per notes, patient was placed on 5mg  Amlodipine dose and has had improvement in BP. He denies symptoms of lower extremity edema on Amlodipine.   On review of medications today, appears that patient has actually continued to take his Olmesartan/HCTZ combination rather than Olmesartan only as discussed in January with Dr. Mayford Knife. He does complain of frequent urination today and wonders about stopping HCTZ again.   Today patient denies chest pain, shortness of breath, fatigue, palpitations, melena, hematuria, hemoptysis, diaphoresis, weakness, presyncope, syncope, orthopnea, and PND. He has not had recurrence of headaches since leaving the ED.         Past Medical History:  Diagnosis Date   Asthma    Bradycardia 07/19/2015   CAD (coronary artery disease) 2001   remote anterior STEMI in 2001 s/p cutting balloon angioplasty of the mid LAD and PTCA of ostial diagonal   Claustrophobia 09/22/2013   Diabetes mellitus (HCC)    ED (erectile dysfunction)    GERD (gastroesophageal reflux disease)    Glaucoma    Borderline   History of  colonoscopy 02/17/2003   Hyperlipidemia    Hypertension    Ischemic cardiomyopathy    a. EF 40% at time of MI 2001, then normal by f/u echo at that time.   Nephrolithiasis    OSA (obstructive sleep apnea) 09/22/2013   Sinus bradycardia    Current Medications: Current Meds  Medication Sig   albuterol (PROVENTIL HFA;VENTOLIN HFA) 108 (90 Base) MCG/ACT inhaler Inhale 2 puffs into the lungs every 6 (six) hours as needed for wheezing or shortness of breath.   ALPRAZolam (XANAX) 0.25 MG tablet Take 0.25 mg by mouth at bedtime as needed for anxiety.  amLODipine (NORVASC) 5 MG tablet Take 1 tablet (5 mg total) by mouth daily.   aspirin 81 MG chewable tablet Chew 81 mg by mouth daily.   COVID-19 Specimen Collection KIT See admin instructions. for testing   fluticasone (FLONASE) 50 MCG/ACT nasal spray Place into both nostrils as needed. Use as directed   latanoprost (XALATAN) 0.005 % ophthalmic solution INSTILL 1 DROP INTO EACH EYE AT BEDTIME   metoprolol succinate (TOPROL-XL) 50 MG 24 hr tablet Take 50 mg by mouth daily. Take with or immediately following a meal.   Misc Natural Products (TART CHERRY ADVANCED PO) Take 1 capsule by mouth daily.   montelukast (SINGULAIR) 10 MG tablet Take 10 mg by mouth at bedtime.   Multiple Vitamin (MULTIVITAMIN) tablet Take 1 tablet by mouth daily.   nitroGLYCERIN (NITROLINGUAL) 0.4 MG/SPRAY spray Place 1 spray under the tongue every 5 (five) minutes x 3 doses as needed for chest pain.   olmesartan (BENICAR) 40 MG tablet Take 1 tablet (40 mg total) by mouth daily.   omeprazole (PRILOSEC) 20 MG capsule Take 20 mg by mouth daily.   rosuvastatin (CRESTOR) 5 MG tablet Take 1 tablet by mouth once daily   sildenafil (VIAGRA) 100 MG tablet Take 100 mg by mouth daily as needed for erectile dysfunction.   [DISCONTINUED] metoprolol succinate (TOPROL-XL) 25 MG 24 hr tablet Take 25 mg by mouth daily.   [DISCONTINUED] potassium chloride SA (KLOR-CON) 20 MEQ tablet Take 1 tablet  (20 mEq total) by mouth daily. Please make overdue appt with Dr. Mayford Knife before anymore refills. Thank you 1st attempt    Allergies:   Ivp dye [iodinated contrast media], Gemfibrozil, Iodine, and Metformin   Social History   Occupational History   Occupation: RET    Comment: POSTAL  Tobacco Use   Smoking status: Former    Years: 7    Types: Cigarettes   Smokeless tobacco: Never   Tobacco comments:    Pt Quit 1970's  Vaping Use   Vaping Use: Never used  Substance and Sexual Activity   Alcohol use: No   Drug use: No   Sexual activity: Not on file    Family Hx: The patient's family history includes CAD in his brother; CAD (age of onset: 61) in his father; Leukemia (age of onset: 45) in his mother.  Review of Systems  Constitutional: Negative for weight gain and weight loss.  Cardiovascular:  Negative for chest pain, dyspnea on exertion, irregular heartbeat, leg swelling, orthopnea, palpitations and syncope.  Respiratory:  Negative for cough and shortness of breath.   Neurological:  Positive for headaches (rare). Negative for dizziness and light-headedness.  All other systems reviewed and are negative.    EKGs/Labs/Other Test Reviewed:    EKG:  EKG is not ordered today.    Recent Labs: 01/16/2023: ALT 12; B Natriuretic Peptide 68.7; BUN 15; Creatinine, Ser 1.24; Hemoglobin 15.6; Platelets 227; Potassium 3.7; Sodium 142   Recent Lipid Panel Recent Labs    11/12/22 0910  CHOL 92*  TRIG 94  HDL 33*  LDLCALC 41      Risk Assessment/Calculations/Metrics:              Physical Exam:    VS:  BP 110/60 (BP Location: Right Arm)   Pulse (!) 59   Ht 5\' 7"  (1.702 m)   Wt 156 lb 9.6 oz (71 kg)   SpO2 98%   BMI 24.53 kg/m     Wt Readings from Last 3 Encounters:  03/10/23  156 lb 9.6 oz (71 kg)  11/12/22 159 lb (72.1 kg)  08/20/21 163 lb 9.6 oz (74.2 kg)    Constitutional:      Appearance: Healthy appearance. Not in distress.  Neck:     Vascular: No JVR. JVD  normal.  Pulmonary:     Effort: Pulmonary effort is normal.     Breath sounds: Normal breath sounds. No wheezing. No rhonchi. No rales.  Chest:     Chest wall: Not tender to palpatation.  Cardiovascular:     PMI at left midclavicular line. Normal rate. Regular rhythm. Normal S1. Normal S2.      Murmurs: There is no murmur.     No gallop.  No click. No rub.  Pulses:    Intact distal pulses.  Edema:    Peripheral edema absent.  Abdominal:     General: Bowel sounds are normal.     Palpations: Abdomen is soft.     Tenderness: There is no abdominal tenderness.  Musculoskeletal: Normal range of motion.        General: No tenderness. Skin:    General: Skin is warm and dry.  Neurological:     General: No focal deficit present.     Mental Status: Alert and oriented to person, place and time.         ASSESSMENT & PLAN:   CAD (coronary artery disease) Patient with hx MI 2001 with cutting balloon angioplasty of mid LAD and PTCA of ostial diag. Also s/p CABG 09/2013. No anginal symptoms, dyspnea, or exertional limitations. Will continue Crestor 5mg , ASA 81mg  daily.   HTN (hypertension) Patient with fluctuant BP over the past few months. HCTZ discontinued at January cardiology OV with soft BP (though appears patient continued to take). More recently hypertensive both at PCP and ED and he was placed on Amlodipine 5mg  along with Metoprolol Succinate 50mg . BP in clinic today 110/60 mmHg. Given patient's concern of frequent urination and at goal BP, we will again plan to stop HCTZ 12.5mg  and continue with Olmesartan 40mg  along with Metoprolol Succinate 50mg  and Amlodipine 5mg . Patient has been instructed to keep track of blood pressure twice daily over the next week with this change.   Dyslipidemia LDL at goal <70. Continue with Crestor 5mg .   Lab Results  Component Value Date   LDLCALC 41 11/12/2022     Bradycardia Patient with HR in the 50s on exam today. Suspect this is secondary to  recent increase in Metoprolol dosing. Since BP is at goal and he is asymptomatic, will continue to monitor rate/symptoms.             Dispo:  1 year or sooner if needed.   Medication Adjustments/Labs and Tests Ordered: Current medicines are reviewed at length with the patient today.  Concerns regarding medicines are outlined above.  Tests Ordered: No orders of the defined types were placed in this encounter.  Medication Changes: No orders of the defined types were placed in this encounter.  SignedPerlie Gold, PA-C  03/10/2023 10:10 AM    [facility] [address] [place], [place], [place]  [address] Phone: [phone]; Fax: [phone]

## 2023-03-10 NOTE — Assessment & Plan Note (Signed)
LDL at goal <70. Continue with Crestor 5mg .   Lab Results  Component Value Date   LDLCALC 41 11/12/2022

## 2023-05-10 ENCOUNTER — Telehealth: Payer: Self-pay | Admitting: Cardiology

## 2023-05-10 MED ORDER — AMLODIPINE BESYLATE 5 MG PO TABS: 5.0000 mg | ORAL_TABLET | Freq: Every day | ORAL | 1 refills | Status: DC

## 2023-05-10 NOTE — Telephone Encounter (Signed)
Pt c/o BP issue: STAT if pt c/o blurred vision, one-sided weakness or slurred speech  1. What are your last 5 BP readings?  07/22 - 160/97 07/19 - 156/97 07/18 - 179/102 07/17 - 149/89 07/16 - 168/112 07/15 - 157/87 07/13 - 155/92 07/12 -168/95 07/11 - 160/96 07/10 - 180/90  2. Are you having any other symptoms (ex. Dizziness, headache, blurred vision, passed out)? Headache and lightheadedness   3. What is your BP issue? Patient states headaches have sometimes been so severe, he can not sleep.

## 2023-05-10 NOTE — Telephone Encounter (Signed)
Called patient back about message. Went through patient's medications and discover he has not been taking amlodipine. Patient stated he does not have any more pills. Will send in refill for amlodipine. Encouraged patient to take a dose today and then start taking daily tomorrow. Encouraged patient to continue to check his BP and call our office if taking amlodipine does not help. Will send message to Dr. Mayford Knife for further advisement.

## 2023-05-12 NOTE — Telephone Encounter (Signed)
Per Dr. Mayford Knife, Agree with plan - check BP twice daily starting tomorrow after going back on Amlodipine for a week and call with results.  Called patient, left message for patient to call back.

## 2023-06-08 NOTE — Telephone Encounter (Signed)
Called to follow up on BP readings after starting amlodipine.  No answer, left detailed message per DPR asking patient to call our office with BP log.

## 2023-06-14 NOTE — Telephone Encounter (Signed)
Call to patient to follow up on blood pressure readings since starting amlodipine. No answer, left detailed message asking patient to call our office with his blood pressure log. Letter sent.

## 2023-06-30 ENCOUNTER — Telehealth: Payer: Self-pay | Admitting: Cardiology

## 2023-06-30 NOTE — Telephone Encounter (Signed)
New message   Patient came in the office to give bp readings to Dr Mayford Knife;  These are all morning readings 06-20-23    131/89,   06-21-23 129*71,     06-22-23 113/75 .   06-23-23 126/74,     06-24-23 124/82,     06-25-23 no reading,    06-26-23 120/72,    06-27-23 132/79,     06-28-23 118/76,    06-29-23 137/80.   Please call pt with follow up.

## 2023-07-01 NOTE — Telephone Encounter (Signed)
Reviewed with patient that BP log looks good per Dr. Mayford Knife, who advises him to stay on current medications. Patient verbalizes understanding.

## 2023-09-30 ENCOUNTER — Telehealth: Payer: Self-pay | Admitting: Cardiology

## 2023-09-30 NOTE — Telephone Encounter (Signed)
Call to patient to further assess symptoms, such as what his water intake has been, how he has been taking his meds or if he has had recent weight loss. No answer, left detailed message per DPR asking ptient to call us back to discuss his symptoms and concerns.

## 2023-09-30 NOTE — Telephone Encounter (Signed)
Call back to patient to discuss symptoms.  Patient states that he takes all his blood pressure meds (amlodipine 5 mg daily, toprol 50 mg daily, benicar 40 mg daily) at 9 am and this is also when he checks his BP. He states he has been feeling dizziness/lightheadedness that comes and goes in the mornings, lasting just 1-2 mins at a time. He says it seems to stop by afternoon. He reports this has been going on for about 2 weeks. He also reports he recently lost 5-10 lbs.  I had patient check his bp while on the phone with me at 3:29 pm and he reports it is 123/72. He denies any symptoms at this time.   I advised patient that I would forward his concerns to Dr. Mayford Knife, but in the meantime, I asked him to do the following:  Take toprol by itself in the morning. Take Benicare and amlodipine at lunchtime. Check BP in the afternoon. Call our office with those readings no later than Tuesday 10/05/23 or sooner if his dizziness/lightheadedness does not improve.  I advised patient I would call him with any recommendations Dr. Mayford Knife has.

## 2023-09-30 NOTE — Telephone Encounter (Signed)
Pt returning nurses call regarding BP. Please advise

## 2023-09-30 NOTE — Telephone Encounter (Signed)
Pt c/o BP issue: STAT if pt c/o blurred vision, one-sided weakness or slurred speech  1. What are your last 5 BP readings?  12/12  92/57  12/11  92/56 12/10 101/68 12/09 100/58 12/08  97/55  2. Are you having any other symptoms (ex. Dizziness, headache, blurred vision, passed out)? Lightheadedness and dizziness, patient states it comes and goes.    3. What is your BP issue? Patient states he has been experiencing low blood pressure for the past for the past two weeks.

## 2023-10-08 NOTE — Telephone Encounter (Signed)
Call to patient who advises he took his Toprol in the morning, then took Benicar and amlodipine in the afternoon. Unfortunately, he reports he checked his BP at 9 am every morning when he took his toprol, however he does report that his dizziness/lightheadedness is completely resolved.  His readings are as follows:  10/08/23 9 AM 98/69  10/05/23 9 AM 98/66  10/03/23 9 AM 110/71  10/01/23 9AM 104/67   I had patient take his BP while on the phone with me at 5:39 PM and it was 118/62.  Patient responses forwarded to Dr. Mayford Knife.

## 2023-10-15 NOTE — Telephone Encounter (Signed)
Call to patient to check on BP and give Dr. Norris Cross advice, patient states he is driving and will call us back.

## 2023-10-21 ENCOUNTER — Other Ambulatory Visit: Payer: Self-pay

## 2023-10-21 ENCOUNTER — Emergency Department (HOSPITAL_COMMUNITY)
Admission: EM | Admit: 2023-10-21 | Discharge: 2023-10-21 | Disposition: A | Discharge status: Home / Self Care | Payer: BC Managed Care – PPO | Attending: Emergency Medicine | Admitting: Emergency Medicine

## 2023-10-21 ENCOUNTER — Encounter (HOSPITAL_COMMUNITY): Payer: Self-pay

## 2023-10-21 ENCOUNTER — Emergency Department (HOSPITAL_COMMUNITY): Payer: BC Managed Care – PPO

## 2023-10-21 DIAGNOSIS — Z7982 Long term (current) use of aspirin: Secondary | ICD-10-CM | POA: Diagnosis not present

## 2023-10-21 DIAGNOSIS — R109 Unspecified abdominal pain: Secondary | ICD-10-CM | POA: Diagnosis present

## 2023-10-21 DIAGNOSIS — N200 Calculus of kidney: Secondary | ICD-10-CM | POA: Insufficient documentation

## 2023-10-21 LAB — URINALYSIS, ROUTINE W REFLEX MICROSCOPIC
Bacteria, UA: NONE SEEN
Bilirubin Urine: NEGATIVE
Glucose, UA: NEGATIVE mg/dL
Ketones, ur: NEGATIVE mg/dL
Leukocytes,Ua: NEGATIVE
Nitrite: NEGATIVE
Protein, ur: NEGATIVE mg/dL
Specific Gravity, Urine: 1.011 (ref 1.005–1.030)
pH: 5 (ref 5.0–8.0)

## 2023-10-21 LAB — CBC WITH DIFFERENTIAL/PLATELET
Abs Immature Granulocytes: 0.02 10*3/uL (ref 0.00–0.07)
Basophils Absolute: 0.1 10*3/uL (ref 0.0–0.1)
Basophils Relative: 1 %
Eosinophils Absolute: 0.2 10*3/uL (ref 0.0–0.5)
Eosinophils Relative: 2 %
HCT: 43.1 % (ref 39.0–52.0)
Hemoglobin: 14.8 g/dL (ref 13.0–17.0)
Immature Granulocytes: 0 %
Lymphocytes Relative: 22 %
Lymphs Abs: 2.1 10*3/uL (ref 0.7–4.0)
MCH: 31.3 pg (ref 26.0–34.0)
MCHC: 34.3 g/dL (ref 30.0–36.0)
MCV: 91.1 fL (ref 80.0–100.0)
Monocytes Absolute: 0.6 10*3/uL (ref 0.1–1.0)
Monocytes Relative: 6 %
Neutro Abs: 6.6 10*3/uL (ref 1.7–7.7)
Neutrophils Relative %: 69 %
Platelets: 232 10*3/uL (ref 150–400)
RBC: 4.73 MIL/uL (ref 4.22–5.81)
RDW: 13.7 % (ref 11.5–15.5)
WBC: 9.5 10*3/uL (ref 4.0–10.5)
nRBC: 0 % (ref 0.0–0.2)

## 2023-10-21 LAB — COMPREHENSIVE METABOLIC PANEL
ALT: 17 U/L (ref 0–44)
AST: 22 U/L (ref 15–41)
Albumin: 4.2 g/dL (ref 3.5–5.0)
Alkaline Phosphatase: 44 U/L (ref 38–126)
Anion gap: 8 (ref 5–15)
BUN: 16 mg/dL (ref 8–23)
CO2: 22 mmol/L (ref 22–32)
Calcium: 9 mg/dL (ref 8.9–10.3)
Chloride: 109 mmol/L (ref 98–111)
Creatinine, Ser: 1.14 mg/dL (ref 0.61–1.24)
GFR, Estimated: >60 mL/min (ref >60)
Glucose, Bld: 106 mg/dL — ABNORMAL HIGH (ref 70–99)
Potassium: 3.5 mmol/L (ref 3.5–5.1)
Sodium: 139 mmol/L (ref 135–145)
Total Bilirubin: 0.8 mg/dL (ref 0.0–1.2)
Total Protein: 7 g/dL (ref 6.5–8.1)

## 2023-10-21 LAB — LIPASE, BLOOD: Lipase: 48 U/L (ref 11–51)

## 2023-10-21 MED ORDER — HYDROCODONE-ACETAMINOPHEN 5-325 MG PO TABS: 1.0000 | ORAL_TABLET | ORAL | 0 refills | Status: AC | PRN

## 2023-10-21 MED ORDER — HYDROCODONE-ACETAMINOPHEN 5-325 MG PO TABS
1.0000 | ORAL_TABLET | Freq: Once | ORAL | Status: AC
  Administered 2023-10-21: 1 via ORAL
  Filled 2023-10-21: qty 1

## 2023-10-21 NOTE — ED Provider Notes (Signed)
 [place] EMERGENCY DEPARTMENT AT [facility] Provider Note   CSN: [id number] Arrival date & time: 10/21/23  1027     History  Chief Complaint  Patient presents with   Flank Pain    Scott Schwartz is a 73 y.o. male.  73 year old male with a history of kidney stones who presents emergency department flank pain.  Patient reports that this morning he started having right-sided flank pain.  Colicky.  Was 10/10 in severity.  No burning or urgency.  No fevers.  Feels similar to prior kidney stones.  Says that while he was in the waiting room he urinated and felt the stone pass.  Currently is asymptomatic.       Home Medications Prior to Admission medications   Medication Sig Start Date End Date Taking? Authorizing Provider  HYDROcodone -acetaminophen  (NORCO/VICODIN) 5-325 MG tablet Take 1 tablet by mouth every 4 (four) hours as needed. 10/21/23  Yes Yolande Lamar BROCKS, MD  albuterol  (PROVENTIL  HFA;VENTOLIN  HFA) 108 (90 Base) MCG/ACT inhaler Inhale 2 puffs into the lungs every 6 (six) hours as needed for wheezing or shortness of breath.    [provider]  ALPRAZolam (XANAX) 0.25 MG tablet Take 0.25 mg by mouth at bedtime as needed for anxiety.    [provider]  amLODipine  (NORVASC ) 5 MG tablet Take 1 tablet (5 mg total) by mouth daily. 05/10/23   Trudy Birmingham, PA-C  aspirin 81 MG chewable tablet Chew 81 mg by mouth daily.    [provider]  COVID-19 Specimen Collection KIT See admin instructions. for testing 07/18/20   [provider]  fluticasone (FLONASE) 50 MCG/ACT nasal spray Place into both nostrils as needed. Use as directed    [provider]  latanoprost (XALATAN) 0.005 % ophthalmic solution INSTILL 1 DROP INTO EACH EYE AT BEDTIME 04/28/19   [provider]  metoprolol  succinate (TOPROL -XL) 50 MG 24 hr tablet Take 50 mg by mouth daily. Take with or immediately following a meal.    [provider]  Misc  Natural Products (TART CHERRY ADVANCED PO) Take 1 capsule by mouth daily.    [provider]  montelukast (SINGULAIR) 10 MG tablet Take 10 mg by mouth at bedtime.    [provider]  Multiple Vitamin (MULTIVITAMIN) tablet Take 1 tablet by mouth daily.    [provider]  nitroGLYCERIN  (NITROLINGUAL ) 0.4 MG/SPRAY spray Place 1 spray under the tongue every 5 (five) minutes x 3 doses as needed for chest pain. 08/20/21   Shlomo Wilbert SAUNDERS, MD  olmesartan  (BENICAR ) 40 MG tablet Take 1 tablet (40 mg total) by mouth daily. 11/12/22   Shlomo Wilbert SAUNDERS, MD  omeprazole (PRILOSEC) 20 MG capsule Take 20 mg by mouth daily.    [provider]  rosuvastatin  (CRESTOR ) 5 MG tablet Take 1 tablet by mouth once daily 11/18/22   Shlomo Wilbert SAUNDERS, MD  sildenafil (VIAGRA) 100 MG tablet Take 100 mg by mouth daily as needed for erectile dysfunction.    [provider]      Allergies    Ivp dye [iodinated contrast media], Gemfibrozil, Iodine, and Metformin    Review of Systems   Review of Systems  Physical Exam Updated Vital Signs BP 125/78   Pulse (!) 45   Temp 98.5 F (36.9 C) (Oral)   Resp 13   Ht 5' 7 (1.702 m)   Wt 70.3 kg   SpO2 96%   BMI 24.28 kg/m  Physical Exam Vitals and nursing note  reviewed.  Constitutional:      General: He is not in acute distress.    Appearance: He is well-developed.  HENT:     Head: Normocephalic and atraumatic.     Right Ear: External ear normal.     Left Ear: External ear normal.     Nose: Nose normal.  Eyes:     Extraocular Movements: Extraocular movements intact.     Conjunctiva/sclera: Conjunctivae normal.     Pupils: Pupils are equal, round, and reactive to light.  Pulmonary:     Effort: Pulmonary effort is normal. No respiratory distress.  Abdominal:     General: There is no distension.     Palpations: Abdomen is soft. There is no mass.     Tenderness: There is no abdominal tenderness. There is no right CVA  tenderness, left CVA tenderness or guarding.  Musculoskeletal:     Cervical back: Normal range of motion and neck supple.  Skin:    General: Skin is warm and dry.  Neurological:     Mental Status: He is alert. Mental status is at baseline.  Psychiatric:        Mood and Affect: Mood normal.        Behavior: Behavior normal.     ED Results / Procedures / Treatments   Labs (all labs ordered are listed, but only abnormal results are displayed) Labs Reviewed  COMPREHENSIVE METABOLIC PANEL - Abnormal; Notable for the following components:      Result Value   Glucose, Bld 106 (*)    All other components within normal limits  URINALYSIS, ROUTINE W REFLEX MICROSCOPIC - Abnormal; Notable for the following components:   Hgb urine dipstick SMALL (*)    All other components within normal limits  LIPASE, BLOOD  CBC WITH DIFFERENTIAL/PLATELET    EKG None  Radiology CT Renal Stone Study Result Date: 10/21/2023 CLINICAL DATA:  Right flank pain since this morning. History of kidney stones. EXAM: CT ABDOMEN AND PELVIS WITHOUT CONTRAST TECHNIQUE: Multidetector CT imaging of the abdomen and pelvis was performed following the standard protocol without IV contrast. RADIATION DOSE REDUCTION: This exam was performed according to the departmental dose-optimization program which includes automated exposure control, adjustment of the mA and/or kV according to patient size and/or use of iterative reconstruction technique. COMPARISON:  CT 12/12/2018. FINDINGS: Lower chest: Bilateral lower lobe mild bronchiectasis. There is some basilar scarring and atelectatic changes as well. Hepatobiliary: No focal liver abnormality is seen. No gallstones, gallbladder wall thickening, or biliary dilatation. Pancreas: Twos 1 Spleen: Normal in size without focal abnormality. Adrenals/Urinary Tract: Adrenal glands are preserved. There are numerous bilateral renal cysts identified. Of the larger foci on the left measures 5.7 cm in  diameter. Lesions bilaterally has Hounsfield units of less than 20. Extent and distribution is similar to previous. Again based on prior and appearance today Bosniak 1 and 2 lesions. 8 mm lower pole right-sided renal stone. Separate punctate nonobstructing lower pole right-sided stone. There is also small stones in the left kidney, least 2. No ureteral stones are identified at this time. The ureters have normal course and caliber. There is a calcification dependently in the urinary bladder. Please correlate for recently passed stone. Bladder has a preserved contour overall. Stomach/Bowel: On this non oral contrast exam the bowel has a normal course and caliber with scattered stool. Left-sided colonic diverticula. Normal appendix. Stomach is underdistended. Vascular/Lymphatic: Aortic atherosclerosis. No enlarged abdominal or pelvic lymph nodes. Circumaortic left renal vein. Reproductive: Prostate is unremarkable.  Other: Small fat containing umbilical hernia and small inguinal hernias, left-greater-than-right. No free air or free fluid. Musculoskeletal: Curvature of the spine with moderate multilevel degenerative changes. Multilevel posterior osteophytes and disc bulging with stenosis throughout the lumbar spine. Please correlate with symptoms and history. IMPRESSION: Stone in the urinary bladder. Please correlate for recently passed stone. There are no ureteral stones at this time. Bilateral nonobstructing renal stones. Colonic diverticula.  Normal appendix. Numerous bilateral benign-appearing renal cysts as on prior. Electronically Signed   By: Ranell Bring M.D.   On: 10/21/2023 13:13    Procedures Procedures    Medications Ordered in ED Medications  HYDROcodone -acetaminophen  (NORCO/VICODIN) 5-325 MG per tablet 1 tablet (1 tablet Oral Given 10/21/23 2055)    ED Course/ Medical Decision Making/ A&P                                 Medical Decision Making Risk Prescription drug management.   Scott Schwartz is a 73 y.o. male with comorbidities that complicate the patient evaluation including kidney stones who presents with flank pain  Initial Ddx:  Nephrolithiasis, pyelonephritis, lumbar radiculopathy, AAA  MDM:  Feel the patient likely has nephrolithiasis based on their symptoms.  Will obtain urinalysis to assess for pyelonephritis as well but feel this is less likely given the lack of systemic symptoms.  Given age will also assess for AAA with CT scan. No symptoms that would be suggestive of radiculopathy.  Plan:  Labs Urinalysis CT stone protocol Pain medication  ED Summary/Re-evaluation:  CT shows stone that appears to be in the patient's bladder afterwards patient reports that he may have passed the stone while urinating.  Urinalysis without signs of urinary tract infection.  No AKI on lab work.  Patient's pain was able to be controlled in the emergency department upon reevaluation.  Will give him a short course of pain medication in case he has some spasms.  Will have him follow-up with urology as well.  This patient presents to the ED for concern of complaints listed in HPI, this involves an extensive number of treatment options, and is a complaint that carries with it a high risk of complications and morbidity. Disposition including potential need for admission considered.   Dispo: DC Home. Return precautions discussed including, but not limited to, those listed in the AVS. Allowed pt time to ask questions which were answered fully prior to dc.  Additional history obtained from spouse Records reviewed Outpatient Clinic Notes The following labs were independently interpreted: Urinalysis and show  no UTI I independently reviewed the following imaging with scope of interpretation limited to determining acute life threatening conditions related to emergency care: CT Abdomen/Pelvis and agree with the radiologist interpretation with the following exceptions: none   I have reviewed  the patients home medications and made adjustments as needed   Social Determinants of health:  Elderly    Final Clinical Impression(s) / ED Diagnoses Final diagnoses:  Kidney stone    Rx / DC Orders ED Discharge Orders          Ordered    HYDROcodone -acetaminophen  (NORCO/VICODIN) 5-325 MG tablet  Every 4 hours PRN        10/21/23 2059              Yolande Lamar BROCKS, MD 10/23/23 [phone]

## 2023-10-21 NOTE — ED Triage Notes (Signed)
 Pt arrives via POV. PT reports right flank pain since this morning. Hx of kidney stones.

## 2023-10-21 NOTE — Discharge Instructions (Addendum)
 You were seen for your kidney stone in the emergency department.  It likely has passed.   At home, please take Tylenol  for your pain. You may also take the norco we have prescribed you for any breakthrough pain that may have.  Do not take this before driving or operating heavy machinery.  Do not take this medication with alcohol.  Follow-up with urology in a week to discuss your symptoms.   Return immediately to the emergency department if you experience any of the following: fever, unbearable pain, urinary retention, or any other concerning symptoms.    Thank you for visiting our Emergency Department. It was a pleasure taking care of you today.

## 2023-10-21 NOTE — ED Provider Triage Note (Signed)
 Emergency Medicine Provider Triage Evaluation Note  Scott Schwartz , a 73 y.o. male  was evaluated in triage.  Pt complains of flank pain.  Review of Systems  Positive:  Negative:   Physical Exam  BP 129/77 (BP Location: Left Arm)   Pulse (!) 54   Temp 98.5 F (36.9 C) (Oral)   Resp 16   Ht 5' 7 (1.702 m)   Wt 70.3 kg   SpO2 100%   BMI 24.28 kg/m  Gen:   Awake, no distress   Resp:  Normal effort  MSK:   Moves extremities without difficulty  Other:    Medical Decision Making  Medically screening exam initiated at 10:47 AM.  Appropriate orders placed.  Scott Schwartz was informed that the remainder of the evaluation will be completed by another provider, this initial triage assessment does not replace that evaluation, and the importance of remaining in the ED until their evaluation is complete.  Right flank pain x1.5 hours. Also with nausea.  Denies recent trauma. Denies dysuria/hematura. Last BM today. Denies hematochezia.   Denies fever, chest pain, dyspnea, cough,  vomiting, diarrhea.    Scott Schwartz F, [place] 10/21/23 1049

## 2023-10-22 NOTE — Telephone Encounter (Signed)
 Call to patient to request BP log, no answer. Left detailed message per DPR asking patient to submit readings over phone or MyChart.

## 2023-10-25 ENCOUNTER — Other Ambulatory Visit: Payer: Self-pay | Admitting: Cardiology

## 2023-10-26 ENCOUNTER — Encounter: Payer: Self-pay | Admitting: Cardiology

## 2023-11-04 ENCOUNTER — Ambulatory Visit: Payer: BC Managed Care – PPO | Attending: Cardiology | Admitting: Cardiology

## 2023-11-05 ENCOUNTER — Encounter: Payer: Self-pay | Admitting: Cardiology

## 2023-11-09 NOTE — Telephone Encounter (Signed)
Sent MyChart message requesting BP readings 11/09/23.

## 2023-11-11 NOTE — Telephone Encounter (Signed)
See patient message encounter dated 10/26/23.

## 2023-11-27 ENCOUNTER — Other Ambulatory Visit: Payer: Self-pay | Admitting: Cardiology

## 2023-12-03 ENCOUNTER — Telehealth: Payer: Self-pay | Admitting: Cardiology

## 2023-12-03 NOTE — Telephone Encounter (Signed)
*  STAT* If patient is at the pharmacy, call can be transferred to refill team.   1. Which medications need to be refilled? (please list name of each medication and dose if known)   rosuvastatin (CRESTOR) 5 MG tablet   2. Would you like to learn more about the convenience, safety, & potential cost savings by using the [facility]?   3. Are you open to using the [facility] (Type [facility]. ).  4. Which pharmacy/location (including street and city if local pharmacy) is medication to be sent to?  [facility] [address], [place] - [address]   5. Do they need a 30 day or 90 day supply?   90 day  Patient stated he is completely out of this medication.

## 2023-12-03 NOTE — Telephone Encounter (Signed)
Rx was sent in on 11/29/23.

## 2024-02-18 ENCOUNTER — Telehealth: Payer: Self-pay | Admitting: Cardiology

## 2024-02-18 ENCOUNTER — Encounter (HOSPITAL_BASED_OUTPATIENT_CLINIC_OR_DEPARTMENT_OTHER): Payer: Self-pay | Admitting: Cardiology

## 2024-02-18 NOTE — Telephone Encounter (Signed)
 Attempted to call patient, no answer left message requesting a call back.

## 2024-02-18 NOTE — Telephone Encounter (Signed)
 Patient returned call and transferred to triage nurse.  Patient reports he is having terrible headaches, similar to migraines and his BP has been elevated. He did not have any readings to share.   He reports he has stopped taking amlodipine . He states he will discuss this further with Dr. Micael Adas at appt on 02/23/24. Patient also sent MyChart message, advised on ED precautions. Patient verbalized understanding.

## 2024-02-18 NOTE — Telephone Encounter (Signed)
 Left message for patient to call back

## 2024-02-18 NOTE — Telephone Encounter (Signed)
 Patient returned RN's call regarding medication.

## 2024-02-23 ENCOUNTER — Encounter: Payer: Self-pay | Admitting: Cardiology

## 2024-02-23 ENCOUNTER — Ambulatory Visit: Payer: Self-pay | Attending: Cardiology | Admitting: Cardiology

## 2024-02-23 VITALS — BP 138/84 | HR 61 | Ht 67.0 in | Wt 155.6 lb

## 2024-02-23 DIAGNOSIS — I10 Essential (primary) hypertension: Secondary | ICD-10-CM

## 2024-02-23 DIAGNOSIS — E785 Hyperlipidemia, unspecified: Secondary | ICD-10-CM | POA: Diagnosis not present

## 2024-02-23 DIAGNOSIS — I2510 Atherosclerotic heart disease of native coronary artery without angina pectoris: Secondary | ICD-10-CM | POA: Diagnosis not present

## 2024-02-23 LAB — LIPID PANEL
Chol/HDL Ratio: 2.6 ratio (ref 0.0–5.0)
Cholesterol, Total: 90 mg/dL — ABNORMAL LOW (ref 100–199)
HDL: 34 mg/dL — ABNORMAL LOW (ref >39)
LDL Chol Calc (NIH): 40 mg/dL (ref 0–99)
Triglycerides: 75 mg/dL (ref 0–149)
VLDL Cholesterol Cal: 16 mg/dL (ref 5–40)

## 2024-02-23 LAB — ALT: ALT: 17 IU/L (ref 0–44)

## 2024-02-23 MED ORDER — NITROGLYCERIN 0.4 MG/SPRAY TL SOLN: 1.0000 | 3 refills | Status: AC | PRN

## 2024-02-23 NOTE — Patient Instructions (Signed)
 Medication Instructions:  Your physician recommends that you continue on your current medications as directed. Please refer to the Current Medication list given to you today.  *If you need a refill on your cardiac medications before your next appointment, please call your pharmacy*  Lab Work: FLP, ALT today If you have labs (blood work) drawn today and your tests are completely normal, you will receive your results only by: MyChart Message (if you have MyChart) OR A paper copy in the mail If you have any lab test that is abnormal or we need to change your treatment, we will call you to review the results.  Testing/Procedures: NONE  Follow-Up: At [facility], you and your health needs are our priority.  As part of our continuing mission to provide you with exceptional heart care, our providers are all part of one team.  This team includes your primary Cardiologist (physician) and Advanced Practice Providers or APPs (Physician Assistants and Nurse Practitioners) who all work together to provide you with the care you need, when you need it.  Your next appointment:   1 year(s)  Provider:   Micael Adas, MD  We recommend signing up for the patient portal called "MyChart".  Sign up information is provided on this After Visit Summary.  MyChart is used to connect with patients for Virtual Visits (Telemedicine).  Patients are able to view lab/test results, encounter notes, upcoming appointments, etc.  Non-urgent messages can be sent to your provider as well.   To learn more about what you can do with MyChart, go to [email or web address].   Other Instructions

## 2024-02-23 NOTE — Addendum Note (Signed)
 Addended by: Meleane Selinger N on: 02/23/2024 09:04 AM   Modules accepted: Orders

## 2024-02-23 NOTE — Progress Notes (Signed)
 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   Cardiac history is limited in the chart. He had a discharge summary from 2001 that outlined his anatomy, with 75% LAD and mid after diagonal 1, and a 75% ostial diagonal 1. He underwent cutting balloon PTCA of his LAD, reduced from 75% to 10% and balloon PTCA of his ostial diagonal. EF at time of cath was reported to be 40%. Subsequent echocardiogram has demonstrated normalization wtih EF 60-65%, impaired relaxation, normal RV, trivial AI by echo 03/2019. He has previously had edema which improved after cessation of amlodipine . He has history of statin intolerance as well as intolerance to Zetia  due to myalgias but is now on low-dose Crestor .   He is here today for followup and is doing well.  He is concerned that his BP was elevated over the weekend in the 160's systolic but he had eaten salty food.  He brought in all his other BP readings which were well controlled.   He denies any chest pain or pressure, SOB, DOE, PND, orthopnea, LE edema, palpitations or syncope. He has some problems with dizziness at times that he describes as feeling off balance.  He is compliant with his meds and is tolerating meds with no SE.    Past Medical History:  Diagnosis Date   Asthma    Bradycardia 07/19/2015   CAD (coronary artery disease) 2001   remote anterior STEMI in 2001 s/p cutting balloon angioplasty  of the mid LAD and PTCA of ostial diagonal   Claustrophobia 09/22/2013   Diabetes mellitus (HCC)    ED (erectile dysfunction)    GERD (gastroesophageal reflux disease)    Glaucoma    Borderline   History of colonoscopy 02/17/2003   Hyperlipidemia    Hypertension    Ischemic cardiomyopathy    a. EF 40% at time of MI 2001, then normal by f/u echo at that time.   Nephrolithiasis    OSA (obstructive sleep apnea) 09/22/2013   Sinus bradycardia     Past Surgical History:  Procedure Laterality Date   CARDIAC CATHETERIZATION     COLONOSCOPY  02/2003   VASECTOMY      Current Medications: Current Meds  Medication Sig   albuterol  (PROVENTIL  HFA;VENTOLIN  HFA) 108 (90 Base) MCG/ACT inhaler Inhale 2 puffs into the lungs every 6 (six) hours as needed for wheezing or shortness of breath.   aspirin 81 MG chewable tablet Chew 81 mg by mouth daily.   fluticasone (FLONASE) 50 MCG/ACT nasal spray Place into both nostrils as needed. Use as directed   HYDROcodone -acetaminophen  (NORCO/VICODIN) 5-325 MG tablet Take 1 tablet by mouth every 4 (four) hours as needed.   latanoprost (XALATAN) 0.005 % ophthalmic solution INSTILL 1 DROP INTO EACH EYE AT BEDTIME   metoprolol  succinate (TOPROL -XL) 50 MG 24 hr tablet Take 50 mg by mouth daily. Take with or immediately following a meal.   Multiple Vitamin (MULTIVITAMIN) tablet Take  1 tablet by mouth daily.   olmesartan  (BENICAR ) 40 MG tablet Take 1 tablet by mouth once daily   omeprazole (PRILOSEC) 20 MG capsule Take 20 mg by mouth daily.   sildenafil (VIAGRA) 100 MG tablet Take 100 mg by mouth daily as needed for erectile dysfunction.   [DISCONTINUED] nitroGLYCERIN  (NITROLINGUAL ) 0.4 MG/SPRAY spray Place 1 spray under the tongue every 5 (five) minutes x 3 doses as needed for chest pain.    Allergies:   Ivp dye [iodinated contrast media], Gemfibrozil, Iodine, and Metformin   Social History   Socioeconomic History   Marital status: Married    Spouse name: Not on  file   Number of children: 3   Years of education: Not on file   Highest education level: Not on file  Occupational History   Occupation: RET    Comment: POSTAL  Tobacco Use   Smoking status: Former    Types: Cigarettes   Smokeless tobacco: Never   Tobacco comments:    Pt Quit 1970's  Vaping Use   Vaping status: Never Used  Substance and Sexual Activity   Alcohol use: No   Drug use: No   Sexual activity: Not on file  Other Topics Concern   Not on file  Social History Narrative   Pt married 3 children    1 grandchild   Ret [employer]   Former smoker 1 pack x's 36 yrs    Quit in 1970's   Social Drivers of Corporate investment banker Strain: Not on file  Food Insecurity: Not on file  Transportation Needs: Not on file  Physical Activity: Not on file  Stress: Not on file  Social Connections: Not on file     Family History:  The patient's family history includes CAD in his brother; CAD (age of onset: 34) in his father; Leukemia (age of onset: 74) in his mother.  ROS:   Please see the history of present illness.  All other systems are reviewed and otherwise negative.    EKGs/Labs/Other Studies Reviewed:    Studies reviewed are outlined and summarized above. Reports included below if pertinent.  2D Echo 03/2019  1. The left ventricle has normal systolic function with an ejection  fraction of 60-65%. The cavity size was normal. Left ventricular diastolic  Doppler parameters are consistent with impaired relaxation. Indeterminate  filling pressures The E/e' is 8-15.  No evidence of left ventricular regional wall motion abnormalities.   2. The right ventricle has normal systolic function. The cavity was  normal. There is no increase in right ventricular wall thickness.   3. The mitral valve is grossly normal.   4. The tricuspid valve is grossly normal.   5. The aortic valve is tricuspid. Mild thickening of the aortic valve.  Aortic valve regurgitation is trivial by  color flow Doppler. No stenosis  of the aortic valve.   6. The average left ventricular global longitudinal strain is -22.0 %.    EKG Interpretation Date/Time:  Wednesday Feb 23 2024 08:28:54 EDT Ventricular Rate:  48 PR Interval:  182 QRS Duration:  72 QT Interval:  432 QTC Calculation: 385 R Axis:   6  Text Interpretation: Sinus bradycardia When compared with ECG of 16-Jan-2023 10:20, No significant change was found Confirmed by Gaylyn Keas ([id number]) on 02/23/2024 9:01:29 AM    Recent Labs: 10/21/2023: ALT 17; BUN 16; Creatinine, Ser 1.14; Hemoglobin 14.8; Platelets 232; Potassium 3.5; Sodium 139  Recent Lipid Panel    Component Value  Date/Time   CHOL 92 (L) 11/12/2022 0910   TRIG 94 11/12/2022 0910   HDL 33 (L) 11/12/2022 0910   CHOLHDL 2.8 11/12/2022 0910   CHOLHDL 4.7 07/16/2016 1119   VLDL 22 07/16/2016 1119   LDLCALC 41 11/12/2022 0910    PHYSICAL EXAM:    VS:  BP 138/84   Pulse 61   Ht 5\' 7"  (1.702 m)   Wt 155 lb 9.6 oz (70.6 kg)   SpO2 93%   BMI 24.37 kg/m   BMI: Body mass index is 24.37 kg/m. GEN: Well nourished, well developed in no acute distress HEENT: Normal NECK: No JVD; No carotid bruits LYMPHATICS: No lymphadenopathy CARDIAC:RRR, no murmurs, rubs, gallops RESPIRATORY:  Clear to auscultation without rales, wheezing or rhonchi  ABDOMEN: Soft, non-tender, non-distended MUSCULOSKELETAL:  No edema; No deformity  SKIN: Warm and dry NEUROLOGIC:  Alert and oriented x 3 PSYCHIATRIC:  Normal affect  Wt Readings from Last 3 Encounters:  02/23/24 155 lb 9.6 oz (70.6 kg)  10/21/23 155 lb (70.3 kg)  03/10/23 156 lb 9.6 oz (71 kg)     ASSESSMENT & PLAN:   ASCAD -Cardiac cath 2001 with 75% LAD and mid after diagonal 1, and a 75% ostial diagonal 1 s/p cutting balloon PTCA of his LAD, reduced from 75% to 10% and balloon PTCA of his ostial diagonal  - He has not had any anginal symptoms since I saw him last - Continue aspirin 81 mg daily, Toprol -XL 50 mg daily,  Crestor  5 mg daily with as needed refillsre   HTN - BP controlled on exam today - Continue Toprol -XL to 50 mg daily, olmesartan  40 mg daily with as needed refills -I have personally reviewed and interpreted outside labs performed by patient's PCP which showed sCR 1.14 and K+ 3.5 on 10/21/23  Dyslipidemia  -LDL goal is less than 70  -I have personally reviewed and interpreted outside labs performed by patient's PCP which showed LDL 62, HDL 30 on 02/2023 and ALT 17 on 10/21/2023 -Continue Crestor  5 mg daily with as needed refills -check FLP and ALT today   Disposition: F/u in 1 year  Medication Adjustments/Labs and Tests Ordered: Current medicines are reviewed at length with the patient today.  Concerns regarding medicines are outlined above. Medication changes, Labs and Tests ordered today are summarized above and listed in the Patient Instructions accessible in Encounters.   Signed, Gaylyn Keas, MD  02/23/2024 9:00 AM    [facility] [address] [place], [place], [place]  [address] Phone: [phone]; Fax: [phone]

## 2024-04-03 ENCOUNTER — Telehealth: Payer: Self-pay | Admitting: Neurology

## 2024-04-03 NOTE — Telephone Encounter (Signed)
 Pt stopped in and said mask broke, needs to know where to get a new one

## 2024-05-26 ENCOUNTER — Other Ambulatory Visit: Payer: Self-pay | Admitting: Cardiology

## 2024-08-12 ENCOUNTER — Other Ambulatory Visit: Payer: Self-pay | Admitting: Cardiology

## 2024-08-21 ENCOUNTER — Other Ambulatory Visit: Payer: Self-pay | Admitting: Cardiology

## 2024-08-30 ENCOUNTER — Telehealth: Payer: Self-pay | Admitting: Cardiology

## 2024-08-30 NOTE — Telephone Encounter (Signed)
 Amlodipine  was stopped, per chart.  Returned call to pt and he was made aware.  He was just calling to make sure, as he found a empty, so he just needed clarification.

## 2024-08-30 NOTE — Telephone Encounter (Signed)
*  STAT* If patient is at the pharmacy, call can be transferred to refill team.   1. Which medications need to be refilled? (please list name of each medication and dose if known) amLODipine  (NORVASC ) tablet 5 mg    2. Would you like to learn more about the convenience, safety, & potential cost savings by using the [facility]?   3. Are you open to using the [facility] (Type [facility].  ).   4. Which pharmacy/location (including street and city if local pharmacy) is medication to be sent to? [facility] [address] [place] - [address]    5. Do they need a 30 day or 90 day supply? 90 DAY
